# Patient Record
Sex: Female | Born: 1975 | Race: Black or African American | Hispanic: No | Marital: Married | Smoking: Never smoker
Health system: Southern US, Community
[De-identification: ages and names within clinical notes are randomized; demographics above are authoritative.]

## PROBLEM LIST (undated history)

## (undated) DIAGNOSIS — Z9189 Other specified personal risk factors, not elsewhere classified: Secondary | ICD-10-CM

## (undated) DIAGNOSIS — U071 COVID-19: Secondary | ICD-10-CM

## (undated) DIAGNOSIS — R7611 Nonspecific reaction to tuberculin skin test without active tuberculosis: Secondary | ICD-10-CM

## (undated) DIAGNOSIS — E2839 Other primary ovarian failure: Secondary | ICD-10-CM

## (undated) DIAGNOSIS — N6019 Diffuse cystic mastopathy of unspecified breast: Secondary | ICD-10-CM

## (undated) DIAGNOSIS — M549 Dorsalgia, unspecified: Secondary | ICD-10-CM

## (undated) DIAGNOSIS — M8580 Other specified disorders of bone density and structure, unspecified site: Secondary | ICD-10-CM

## (undated) DIAGNOSIS — N970 Female infertility associated with anovulation: Secondary | ICD-10-CM

## (undated) DIAGNOSIS — E288 Other ovarian dysfunction: Secondary | ICD-10-CM

## (undated) HISTORY — DX: Female infertility associated with anovulation: N97.0

## (undated) HISTORY — PX: BREAST SURGERY: SHX581

## (undated) HISTORY — DX: Nonspecific reaction to tuberculin skin test without active tuberculosis: R76.11

## (undated) HISTORY — PX: BREAST LUMPECTOMY: SHX2

## (undated) HISTORY — DX: Other specified disorders of bone density and structure, unspecified site: M85.80

## (undated) HISTORY — DX: Other specified personal risk factors, not elsewhere classified: Z91.89

## (undated) HISTORY — DX: Diffuse cystic mastopathy of unspecified breast: N60.19

## (undated) HISTORY — PX: BREAST EXCISIONAL BIOPSY: SUR124

## (undated) HISTORY — DX: Dorsalgia, unspecified: M54.9

## (undated) HISTORY — DX: Other ovarian dysfunction: E28.8

## (undated) HISTORY — DX: Other primary ovarian failure: E28.39

## (undated) HISTORY — PX: BREAST BIOPSY: SHX20

## (undated) HISTORY — DX: COVID-19: U07.1

---

## 1997-07-22 HISTORY — PX: BREAST LUMPECTOMY: SHX2

## 1997-08-22 ENCOUNTER — Ambulatory Visit (HOSPITAL_COMMUNITY): Admission: RE | Admit: 1997-08-22 | Discharge: 1997-08-22 | Payer: Self-pay | Admitting: Obstetrics and Gynecology

## 1997-12-28 ENCOUNTER — Ambulatory Visit (HOSPITAL_COMMUNITY): Admission: RE | Admit: 1997-12-28 | Discharge: 1997-12-28 | Payer: Self-pay | Admitting: Family Medicine

## 2004-03-09 ENCOUNTER — Other Ambulatory Visit: Admission: RE | Admit: 2004-03-09 | Discharge: 2004-03-09 | Payer: Self-pay | Admitting: Obstetrics and Gynecology

## 2006-08-19 ENCOUNTER — Encounter: Admission: RE | Admit: 2006-08-19 | Discharge: 2006-08-19 | Payer: Self-pay | Admitting: Emergency Medicine

## 2006-08-19 ENCOUNTER — Encounter: Admission: RE | Admit: 2006-08-19 | Discharge: 2006-11-17 | Payer: Self-pay | Admitting: Emergency Medicine

## 2006-10-22 ENCOUNTER — Encounter: Admission: RE | Admit: 2006-10-22 | Discharge: 2006-10-22 | Payer: Self-pay | Admitting: Obstetrics and Gynecology

## 2015-07-04 ENCOUNTER — Ambulatory Visit (INDEPENDENT_AMBULATORY_CARE_PROVIDER_SITE_OTHER): Payer: Managed Care, Other (non HMO)

## 2015-07-04 ENCOUNTER — Ambulatory Visit (INDEPENDENT_AMBULATORY_CARE_PROVIDER_SITE_OTHER): Payer: Managed Care, Other (non HMO) | Admitting: Emergency Medicine

## 2015-07-04 VITALS — BP 108/76 | HR 60 | Temp 97.7°F | Resp 16 | Ht 69.0 in | Wt 147.0 lb

## 2015-07-04 DIAGNOSIS — Z0000 Encounter for general adult medical examination without abnormal findings: Secondary | ICD-10-CM

## 2015-07-04 DIAGNOSIS — Z111 Encounter for screening for respiratory tuberculosis: Secondary | ICD-10-CM

## 2015-07-04 NOTE — Progress Notes (Signed)
Subjective:  Patient ID: Chelsea Wagner, female    DOB: [birth year]  Age: 39 y.o. MRN: [id number]  CC: Annual Exam   HPI Chelsea Wagner presents  she comes for a annual physical. She has no acute medical problems. Has no complaints. Takes no medication. She does have a history of prior lumpectomy which was negative. 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   She has past surgical history that includes Breast surgery.   Her  family history includes Diabetes in her mother; Heart disease in her mother; Hyperlipidemia in her father; Hypertension in her father and mother; Mental illness in her father.  She   reports that she has never smoked. She has never used smokeless tobacco. She reports that she drinks about 0.6 oz of alcohol per week. She reports that she does not use illicit drugs.  No outpatient prescriptions prior to visit.   No facility-administered medications prior to visit.    Social History   Social History  . Marital Status: Married    Spouse Name: N/A  . Number of Children: N/A  . Years of Education: N/A   Social History Main Topics  . Smoking status: Never Smoker   . Smokeless tobacco: Never Used  . Alcohol Use: 0.6 oz/week    1 Standard drinks or equivalent per week  . Drug Use: No  . Sexual Activity: Not Asked   Other Topics Concern  . None   Social History Narrative  . None     Review of Systems  Constitutional: Negative for fever, chills and appetite change.  HENT: Negative for congestion, ear pain, postnasal drip, sinus pressure and sore throat.   Eyes: Negative for pain and redness.  Respiratory: Negative for cough, shortness of breath and wheezing.   Cardiovascular: Negative for leg swelling.  Gastrointestinal: Negative for nausea, vomiting, abdominal pain, diarrhea, constipation and blood in stool.  Endocrine: Negative for polyuria.  Genitourinary: Negative for dysuria,  urgency, frequency and flank pain.  Musculoskeletal: Negative for gait problem.  Skin: Negative for rash.  Neurological: Negative for weakness and headaches.  Psychiatric/Behavioral: Negative for confusion and decreased concentration. The patient is not nervous/anxious.     Objective:  BP 108/76 mmHg  Pulse 60  Temp(Src) 97.7 F (36.5 C) (Oral)  Resp 16  Ht  (1.753 m)  Wt 147 lb (66.679 kg)  BMI 21.70 kg/m2  SpO2 99%  LMP 11/02/2011  Physical Exam  Constitutional: She is oriented to person, place, and time. She appears well-developed and well-nourished. No distress.  HENT:  Head: Normocephalic and atraumatic.  Right Ear: External ear normal.  Left Ear: External ear normal.  Nose: Nose normal.  Eyes: Conjunctivae and EOM are normal. Pupils are equal, round, and reactive to light. No scleral icterus.  Neck: Normal range of motion. Neck supple. No tracheal deviation present.  Cardiovascular: Normal rate, regular rhythm and normal heart sounds.   Pulmonary/Chest: Effort normal. No respiratory distress. She has no wheezes. She has no rales.  Abdominal: She exhibits no mass. There is no tenderness. There is no rebound and no guarding.  Musculoskeletal: She exhibits no edema.  Lymphadenopathy:    She has no cervical adenopathy.  Neurological: She is alert and oriented to person, place, and time. Coordination normal.  Skin: Skin is warm and dry. No rash noted.  Psychiatric: She has a normal mood and affect. Her behavior is normal.  Assessment & Plan:   Chelsea Wagner was seen today for annual exam.  Diagnoses and all orders for this visit:  Annual physical exam -     Cancel: TB Skin Test -     Prescription Abuse Monitoring, 10P, Urine  Screening-pulmonary TB -     DG Chest 2 View; Future  Chelsea Wagner does not currently have medications on file.  No orders of the defined types were placed in this encounter.    Appropriate red flag conditions were discussed with the  patient as well as actions that should be taken.  Patient expressed his understanding.  Follow-up: No Follow-up on file.  Carmelina DaneAnderson, Tanveer Brammer S, MD    [facility] reading (PRIMARY) by  Dr. Dareen PianoAnderson negative chest.

## 2015-07-05 LAB — PRESCRIPTION ABUSE MONITORING 10P, URINE
Amphetamine/Meth: NEGATIVE ng/mL
BUPRENORPHINE, URINE: NEGATIVE ng/mL
Barbiturate Screen, Urine: NEGATIVE ng/mL
Benzodiazepine Screen, Urine: NEGATIVE ng/mL
CANNABINOID SCRN UR: NEGATIVE ng/mL
CREATININE, URINE: 64.51 mg/dL (ref >20.0)
Cocaine Metabolites: NEGATIVE ng/mL
Methadone Screen, Urine: NEGATIVE ng/mL
OPIATE SCREEN, URINE: NEGATIVE ng/mL
OXYCODONE SCRN UR: NEGATIVE ng/mL
PROPOXYPHENE: NEGATIVE ng/mL

## 2016-05-03 ENCOUNTER — Ambulatory Visit: Payer: Self-pay | Admitting: Family Medicine

## 2016-05-22 ENCOUNTER — Encounter: Payer: Self-pay | Admitting: Family Medicine

## 2016-05-22 ENCOUNTER — Ambulatory Visit (INDEPENDENT_AMBULATORY_CARE_PROVIDER_SITE_OTHER): Payer: Managed Care, Other (non HMO) | Admitting: Family Medicine

## 2016-05-22 VITALS — BP 106/68 | HR 72 | Temp 97.9°F | Resp 12 | Ht 69.0 in | Wt 146.4 lb

## 2016-05-22 DIAGNOSIS — Z1231 Encounter for screening mammogram for malignant neoplasm of breast: Secondary | ICD-10-CM | POA: Diagnosis not present

## 2016-05-22 DIAGNOSIS — Z0000 Encounter for general adult medical examination without abnormal findings: Secondary | ICD-10-CM

## 2016-05-22 DIAGNOSIS — Z1239 Encounter for other screening for malignant neoplasm of breast: Secondary | ICD-10-CM

## 2016-05-22 DIAGNOSIS — E2839 Other primary ovarian failure: Secondary | ICD-10-CM

## 2016-05-22 DIAGNOSIS — Z23 Encounter for immunization: Secondary | ICD-10-CM | POA: Diagnosis not present

## 2016-05-22 DIAGNOSIS — E288 Other ovarian dysfunction: Secondary | ICD-10-CM

## 2016-05-22 LAB — LIPID PANEL
Cholesterol: 198 mg/dL (ref 0–200)
HDL: 98.3 mg/dL (ref >39.00)
LDL CALC: 89 mg/dL (ref 0–99)
NONHDL: 99.22
Total CHOL/HDL Ratio: 2
Triglycerides: 52 mg/dL (ref 0.0–149.0)
VLDL: 10.4 mg/dL (ref 0.0–40.0)

## 2016-05-22 LAB — COMPREHENSIVE METABOLIC PANEL
ALK PHOS: 53 U/L (ref 39–117)
ALT: 16 U/L (ref 0–35)
AST: 20 U/L (ref 0–37)
Albumin: 4.5 g/dL (ref 3.5–5.2)
BUN: 13 mg/dL (ref 6–23)
CHLORIDE: 103 meq/L (ref 96–112)
CO2: 30 mEq/L (ref 19–32)
Calcium: 10 mg/dL (ref 8.4–10.5)
Creatinine, Ser: 0.89 mg/dL (ref 0.40–1.20)
GFR: 89.46 mL/min (ref >60.00)
GLUCOSE: 63 mg/dL — AB (ref 70–99)
POTASSIUM: 4.1 meq/L (ref 3.5–5.1)
Sodium: 140 mEq/L (ref 135–145)
TOTAL PROTEIN: 7.6 g/dL (ref 6.0–8.3)
Total Bilirubin: 0.5 mg/dL (ref 0.2–1.2)

## 2016-05-22 LAB — CBC
HEMATOCRIT: 42.3 % (ref 36.0–46.0)
HEMOGLOBIN: 14.2 g/dL (ref 12.0–15.0)
MCHC: 33.5 g/dL (ref 30.0–36.0)
MCV: 97 fl (ref 78.0–100.0)
Platelets: 303 10*3/uL (ref 150.0–400.0)
RBC: 4.36 Mil/uL (ref 3.87–5.11)
RDW: 14.5 % (ref 11.5–15.5)
WBC: 5.2 10*3/uL (ref 4.0–10.5)

## 2016-05-22 NOTE — Assessment & Plan Note (Signed)
Pap smear up-to-date. Wants to see a female GYN. Placing referral. In need of mammogram. Ordered today. Tetanus up to date. Flu given today. Screening labs today.

## 2016-05-22 NOTE — Patient Instructions (Signed)
We will call with the referral and with your lab results.  We will also call regarding your mammogram.  Follow up annually.  Take care  Dr. Lacinda Axon   Health Maintenance, Female Adopting a healthy lifestyle and getting preventive care can go a long way to promote health and wellness. Talk with your health care provider about what schedule of regular examinations is right for you. This is a good chance for you to check in with your provider about disease prevention and staying healthy. In between checkups, there are plenty of things you can do on your own. Experts have done a lot of research about which lifestyle changes and preventive measures are most likely to keep you healthy. Ask your health care provider for more information. WEIGHT AND DIET  Eat a healthy diet  Be sure to include plenty of vegetables, fruits, low-fat dairy products, and lean protein.  Do not eat a lot of foods high in solid fats, added sugars, or salt.  Get regular exercise. This is one of the most important things you can do for your health.  Most adults should exercise for at least 150 minutes each week. The exercise should increase your heart rate and make you sweat (moderate-intensity exercise).  Most adults should also do strengthening exercises at least twice a week. This is in addition to the moderate-intensity exercise.  Maintain a healthy weight  Body mass index (BMI) is a measurement that can be used to identify possible weight problems. It estimates body fat based on height and weight. Your health care provider can help determine your BMI and help you achieve or maintain a healthy weight.  For females 8 years of age and older:   A BMI below 18.5 is considered underweight.  A BMI of 18.5 to 24.9 is normal.  A BMI of 25 to 29.9 is considered overweight.  A BMI of 30 and above is considered obese.  Watch levels of cholesterol and blood lipids  You should start having your blood tested for lipids  and cholesterol at 42 years of age, then have this test every 5 years.  You may need to have your cholesterol levels checked more often if:  Your lipid or cholesterol levels are high.  You are older than 42 years of age.  You are at high risk for heart disease.  CANCER SCREENING   Lung Cancer  Lung cancer screening is recommended for adults 22-20 years old who are at high risk for lung cancer because of a history of smoking.  A yearly low-dose CT scan of the lungs is recommended for people who:  Currently smoke.  Have quit within the past 15 years.  Have at least a 30-pack-year history of smoking. A pack year is smoking an average of one pack of cigarettes a day for 1 year.  Yearly screening should continue until it has been 15 years since you quit.  Yearly screening should stop if you develop a health problem that would prevent you from having lung cancer treatment.  Breast Cancer  Practice breast self-awareness. This means understanding how your breasts normally appear and feel.  It also means doing regular breast self-exams. Let your health care provider know about any changes, no matter how small.  If you are in your 20s or 30s, you should have a clinical breast exam (CBE) by a health care provider every 1-3 years as part of a regular health exam.  If you are 51 or older, have a CBE every year.  Also consider having a breast X-ray (mammogram) every year.  If you have a family history of breast cancer, talk to your health care provider about genetic screening.  If you are at high risk for breast cancer, talk to your health care provider about having an MRI and a mammogram every year.  Breast cancer gene (BRCA) assessment is recommended for women who have family members with BRCA-related cancers. BRCA-related cancers include:  Breast.  Ovarian.  Tubal.  Peritoneal cancers.  Results of the assessment will determine the need for genetic counseling and BRCA1 and  BRCA2 testing. Cervical Cancer Your health care provider may recommend that you be screened regularly for cancer of the pelvic organs (ovaries, uterus, and vagina). This screening involves a pelvic examination, including checking for microscopic changes to the surface of your cervix (Pap test). You may be encouraged to have this screening done every 3 years, beginning at age 67.  For women ages 48-65, health care providers may recommend pelvic exams and Pap testing every 3 years, or they may recommend the Pap and pelvic exam, combined with testing for human papilloma virus (HPV), every 5 years. Some types of HPV increase your risk of cervical cancer. Testing for HPV may also be done on women of any age with unclear Pap test results.  Other health care providers may not recommend any screening for nonpregnant women who are considered low risk for pelvic cancer and who do not have symptoms. Ask your health care provider if a screening pelvic exam is right for you.  If you have had past treatment for cervical cancer or a condition that could lead to cancer, you need Pap tests and screening for cancer for at least 20 years after your treatment. If Pap tests have been discontinued, your risk factors (such as having a new sexual partner) need to be reassessed to determine if screening should resume. Some women have medical problems that increase the chance of getting cervical cancer. In these cases, your health care provider may recommend more frequent screening and Pap tests. Colorectal Cancer  This type of cancer can be detected and often prevented.  Routine colorectal cancer screening usually begins at 42 years of age and continues through 42 years of age.  Your health care provider may recommend screening at an earlier age if you have risk factors for colon cancer.  Your health care provider may also recommend using home test kits to check for hidden blood in the stool.  A small camera at the end of  a tube can be used to examine your colon directly (sigmoidoscopy or colonoscopy). This is done to check for the earliest forms of colorectal cancer.  Routine screening usually begins at age 71.  Direct examination of the colon should be repeated every 5-10 years through 42 years of age. However, you may need to be screened more often if early forms of precancerous polyps or small growths are found. Skin Cancer  Check your skin from head to toe regularly.  Tell your health care provider about any new moles or changes in moles, especially if there is a change in a mole's shape or color.  Also tell your health care provider if you have a mole that is larger than the size of a pencil eraser.  Always use sunscreen. Apply sunscreen liberally and repeatedly throughout the day.  Protect yourself by wearing long sleeves, pants, a wide-brimmed hat, and sunglasses whenever you are outside. HEART DISEASE, DIABETES, AND HIGH BLOOD PRESSURE  High blood pressure causes heart disease and increases the risk of stroke. High blood pressure is more likely to develop in:  People who have blood pressure in the high end of the normal range (130-139/85-89 mm Hg).  People who are overweight or obese.  People who are African American.  If you are 18-39 years of age, have your blood pressure checked every 3-5 years. If you are 40 years of age or older, have your blood pressure checked every year. You should have your blood pressure measured twice--once when you are at a hospital or clinic, and once when you are not at a hospital or clinic. Record the average of the two measurements. To check your blood pressure when you are not at a hospital or clinic, you can use:  An automated blood pressure machine at a pharmacy.  A home blood pressure monitor.  If you are between 55 years and 79 years old, ask your health care provider if you should take aspirin to prevent strokes.  Have regular diabetes screenings. This  involves taking a blood sample to check your fasting blood sugar level.  If you are at a normal weight and have a low risk for diabetes, have this test once every three years after 42 years of age.  If you are overweight and have a high risk for diabetes, consider being tested at a younger age or more often. PREVENTING INFECTION  Hepatitis B  If you have a higher risk for hepatitis B, you should be screened for this virus. You are considered at high risk for hepatitis B if:  You were born in a country where hepatitis B is common. Ask your health care provider which countries are considered high risk.  Your parents were born in a high-risk country, and you have not been immunized against hepatitis B (hepatitis B vaccine).  You have HIV or AIDS.  You use needles to inject street drugs.  You live with someone who has hepatitis B.  You have had sex with someone who has hepatitis B.  You get hemodialysis treatment.  You take certain medicines for conditions, including cancer, organ transplantation, and autoimmune conditions. Hepatitis C  Blood testing is recommended for:  Everyone born from 1945 through 1965.  Anyone with known risk factors for hepatitis C. Sexually transmitted infections (STIs)  You should be screened for sexually transmitted infections (STIs) including gonorrhea and chlamydia if:  You are sexually active and are younger than 42 years of age.  You are older than 42 years of age and your health care provider tells you that you are at risk for this type of infection.  Your sexual activity has changed since you were last screened and you are at an increased risk for chlamydia or gonorrhea. Ask your health care provider if you are at risk.  If you do not have HIV, but are at risk, it may be recommended that you take a prescription medicine daily to prevent HIV infection. This is called pre-exposure prophylaxis (PrEP). You are considered at risk if:  You are  sexually active and do not regularly use condoms or know the HIV status of your partner(s).  You take drugs by injection.  You are sexually active with a partner who has HIV. Talk with your health care provider about whether you are at high risk of being infected with HIV. If you choose to begin PrEP, you should first be tested for HIV. You should then be tested every 3 months for as   long as you are taking PrEP.  PREGNANCY   If you are premenopausal and you may become pregnant, ask your health care provider about preconception counseling.  If you may become pregnant, take 400 to 800 micrograms (mcg) of folic acid every day.  If you want to prevent pregnancy, talk to your health care provider about birth control (contraception). OSTEOPOROSIS AND MENOPAUSE   Osteoporosis is a disease in which the bones lose minerals and strength with aging. This can result in serious bone fractures. Your risk for osteoporosis can be identified using a bone density scan.  If you are 27 years of age or older, or if you are at risk for osteoporosis and fractures, ask your health care provider if you should be screened.  Ask your health care provider whether you should take a calcium or vitamin D supplement to lower your risk for osteoporosis.  Menopause may have certain physical symptoms and risks.  Hormone replacement therapy may reduce some of these symptoms and risks. Talk to your health care provider about whether hormone replacement therapy is right for you.  HOME CARE INSTRUCTIONS   Schedule regular health, dental, and eye exams.  Stay current with your immunizations.   Do not use any tobacco products including cigarettes, chewing tobacco, or electronic cigarettes.  If you are pregnant, do not drink alcohol.  If you are breastfeeding, limit how much and how often you drink alcohol.  Limit alcohol intake to no more than 1 drink per day for nonpregnant women. One drink equals 12 ounces of beer, 5  ounces of wine, or 1 ounces of hard liquor.  Do not use street drugs.  Do not share needles.  Ask your health care provider for help if you need support or information about quitting drugs.  Tell your health care provider if you often feel depressed.  Tell your health care provider if you have ever been abused or do not feel safe at home.   This information is not intended to replace advice given to you by your health care provider. Make sure you discuss any questions you have with your health care provider.   Document Released: 01/21/2011 Document Revised: 07/29/2014 Document Reviewed: 06/09/2013 Elsevier Interactive Patient Education [employer].

## 2016-05-22 NOTE — Progress Notes (Signed)
Subjective:  Patient ID: Chelsea Wagner, female    DOB: [birth year]  Age: 40 y.o. MRN: [id number]  CC: Establish care, annual exam  HPI Chelsea Wagner is a 40 y.o. female presents to the clinic today to establish care. She is in need of a physical exam.  Preventative Healthcare  Pap smear: 2 years ago. Wants to see GYN.  Mammogram: In need of. Will order.   Colonoscopy: Not indicated.   Immunizations  Tetanus - Up to date.   Pneumococcal - Not indicated.  Flu - In need of.  Labs: Screening labs today.  Exercise: Reports regular exercise.   Alcohol use: See below.  Smoking/tobacco use: Nonsmoker.  PMH, Surgical Hx, Family Hx, Social History reviewed and updated as below.  Past Medical History:  Diagnosis Date  . History of fainting spells of unknown cause   . 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 Premature ovarian failure     Past Surgical History:  Procedure Laterality Date  . BREAST LUMPECTOMY Left 1999  . BREAST SURGERY     Family History  Problem Relation Age of Onset  . Diabetes Mother   . Heart disease Mother   . Hypertension Mother   . Mental illness Father   . Hypertension Father   . Hyperlipidemia Father    Social History  Substance Use Topics  . Smoking status: Never Smoker  . Smokeless tobacco: Never Used  . Alcohol use 0.6 oz/week    1 Standard drinks or equivalent per week   Review of Systems General: Denies unexplained weight loss, fever. Skin: Denies new or changing mole, sore/wound that won't heal. ENT: Trouble hearing, ringing in the ears, sores in the mouth, hoarseness, trouble swallowing. Eyes: Denies trouble seeing/visual disturbance. Heart/CV: Denies chest pain, shortness of breath, edema, palpitations. Lungs/Resp: Denies cough, shortness of breath, hemoptysis. Abd/GI: Denies nausea, vomiting, diarrhea, constipation, abdominal pain, hematochezia, melena. GU: Denies dysuria, incontinence, hematuria, urinary frequency, difficulty  starting/keeping stream, vaginal discharge, sexual difficulty, lump in breasts. MSK: Denies joint pain/swelling, myalgias. Neuro: Denies headaches, weakness, numbness, dizziness, syncope. Psych: Denies sadness, anxiety, stress, memory difficulty. Endocrine: Denies polyuria and polydipsia.  Objective:   Today's Vitals: BP 106/68 (BP Location: Right Arm, Patient Position: Sitting, Cuff Size: Normal)   Pulse 72   Temp 97.9 F (36.6 C) (Oral)   Resp 12   Ht 5' 9"  (1.753 m)   Wt 146 lb 6 oz (66.4 kg)   SpO2 96%   BMI 21.62 kg/m   Physical Exam  Constitutional: She is oriented to person, place, and time. She appears well-developed and well-nourished. No distress.  HENT:  Head: Normocephalic and atraumatic.  Nose: Nose normal.  Mouth/Throat: Oropharynx is clear and moist. No oropharyngeal exudate.  Normal TM's bilaterally.   Eyes: Conjunctivae are normal. No scleral icterus.  Neck: Neck supple. No thyromegaly present.  Cardiovascular: Normal rate and regular rhythm.   No murmur heard. Pulmonary/Chest: Effort normal and breath sounds normal. She has no wheezes. She has no rales.  Abdominal: Soft. She exhibits no distension. There is no tenderness. There is no rebound and no guarding.  Musculoskeletal: Normal range of motion. She exhibits no edema.  Lymphadenopathy:    She has no cervical adenopathy.  Neurological: She is alert and oriented to person, place, and time.  Skin: Skin is warm and dry. No rash noted.  Psychiatric: She has a normal mood and affect.  Vitals reviewed.  Assessment & Plan:   Problem List Items Addressed This Visit  Premature ovarian failure   Relevant Orders   Ambulatory referral to Gynecology   Annual physical exam - Primary    Pap smear up-to-date. Wants to see a female GYN. Placing referral. In need of mammogram. Ordered today. Tetanus up to date. Flu given today. Screening labs today.      Relevant Orders   CBC   Comp Met (CMET)   Lipid  panel    Other Visit Diagnoses    Breast cancer screening       Relevant Orders   MM Digital Screening     Follow-up: Return in about 1 year (around 05/22/2017).  [place]

## 2016-05-27 ENCOUNTER — Encounter: Payer: Self-pay | Admitting: Family Medicine

## 2016-06-05 ENCOUNTER — Encounter: Payer: Self-pay | Admitting: Family Medicine

## 2016-07-16 ENCOUNTER — Ambulatory Visit
Admission: RE | Admit: 2016-07-16 | Discharge: 2016-07-16 | Disposition: A | Discharge status: Home / Self Care | Payer: Managed Care, Other (non HMO) | Source: Ambulatory Visit | Attending: Family Medicine | Admitting: Family Medicine

## 2016-07-16 DIAGNOSIS — Z1239 Encounter for other screening for malignant neoplasm of breast: Secondary | ICD-10-CM

## 2016-07-16 DIAGNOSIS — Z1231 Encounter for screening mammogram for malignant neoplasm of breast: Secondary | ICD-10-CM | POA: Diagnosis present

## 2016-09-03 ENCOUNTER — Telehealth: Payer: Self-pay | Admitting: Family Medicine

## 2016-09-03 NOTE — Telephone Encounter (Signed)
Given to provider to feel out.

## 2016-09-03 NOTE — Telephone Encounter (Signed)
Pt dropped off Health Examination form to be filled out.Marland Kitchen. Please advise.Marland Kitchen. Placed in Dr. Shiela Mayerooks folder upfront.. Pt states that a letter was faxed to us to help fill form out this form.. Please fax to [phone] when completed

## 2016-09-04 NOTE — Telephone Encounter (Signed)
Form was faxed 

## 2016-09-04 NOTE — Telephone Encounter (Signed)
Pt called and a voicemail was left letting her know form was faxed.

## 2016-09-04 NOTE — Telephone Encounter (Signed)
Form filled out

## 2017-02-05 ENCOUNTER — Telehealth: Payer: Self-pay | Admitting: Family Medicine

## 2017-02-05 NOTE — Telephone Encounter (Signed)
Reason for call: low back pain  Symptoms: numbness left leg, hurts to sit, walking makes it worse,  Duration this week  Medications: Last seen for this problem: n/a  Seen by: n/a No appointments available today, gave number to [facility]

## 2017-02-05 NOTE — Telephone Encounter (Signed)
Pt's states she is in severe back pain. She is requesting a referral to a back surgeon. I told her she would most likely have to see Dr. Adriana Simasook first before a referral. Please advise pt.

## 2017-02-24 ENCOUNTER — Encounter: Payer: Self-pay | Admitting: Family Medicine

## 2017-05-23 ENCOUNTER — Encounter: Payer: Managed Care, Other (non HMO) | Admitting: Family Medicine

## 2017-06-10 ENCOUNTER — Other Ambulatory Visit: Payer: Self-pay

## 2017-06-10 ENCOUNTER — Ambulatory Visit (INDEPENDENT_AMBULATORY_CARE_PROVIDER_SITE_OTHER): Payer: Managed Care, Other (non HMO) | Admitting: Internal Medicine

## 2017-06-10 ENCOUNTER — Encounter: Payer: Self-pay | Admitting: Internal Medicine

## 2017-06-10 VITALS — BP 98/66 | HR 67 | Temp 98.0°F | Ht 69.0 in | Wt 153.0 lb

## 2017-06-10 DIAGNOSIS — M8588 Other specified disorders of bone density and structure, other site: Secondary | ICD-10-CM

## 2017-06-10 DIAGNOSIS — Z13220 Encounter for screening for lipoid disorders: Secondary | ICD-10-CM | POA: Diagnosis not present

## 2017-06-10 DIAGNOSIS — Z0000 Encounter for general adult medical examination without abnormal findings: Secondary | ICD-10-CM | POA: Diagnosis not present

## 2017-06-10 DIAGNOSIS — Z1329 Encounter for screening for other suspected endocrine disorder: Secondary | ICD-10-CM | POA: Diagnosis not present

## 2017-06-10 DIAGNOSIS — Z1231 Encounter for screening mammogram for malignant neoplasm of breast: Secondary | ICD-10-CM | POA: Diagnosis not present

## 2017-06-10 DIAGNOSIS — Z1159 Encounter for screening for other viral diseases: Secondary | ICD-10-CM | POA: Diagnosis not present

## 2017-06-10 NOTE — Progress Notes (Signed)
Chief Complaint  Patient presents with  . Annual Exam  . URI   Annual wellness  1. Pt reports she has cold today x 1 week tried tea, Mucinex. She reports sx's started in chest and now progressed to nasal and head congestion though smell not limited. She also reports h/o unpacking dusty boxes.  She is overall feeling better. Denies sore throat, chills, body aches, fever.  2. H/o osteopenia [facility] [place] need to get copy. She is sch for pap 08/2017 [facility]    URI   Associated symptoms include congestion and coughing. Pertinent negatives include no abdominal pain, chest pain or sore throat.   Review of Systems  Constitutional: Negative for chills and fever.  HENT: Positive for congestion. Negative for sore throat.   Respiratory: Positive for cough. Negative for shortness of breath.   Cardiovascular: Negative for chest pain.  Gastrointestinal: Negative for abdominal pain and blood in stool.   Past Medical History:  Diagnosis Date  . Back pain    low back pain   . Fibrocystic breast   . History of fainting spells of unknown cause   . Infertility associated with anovulation   . Osteopenia   . 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 Premature ovarian failure    Past Surgical History:  Procedure Laterality Date  . BREAST BIOPSY Left    age 41's  . BREAST LUMPECTOMY Left 1999  . BREAST LUMPECTOMY     benign tumor   . BREAST SURGERY     Family History  Problem Relation Age of Onset  . Diabetes Mother   . Heart disease Mother   . Hypertension Mother   . Fibromyalgia Mother   . Other Mother        cardiac arrythmia   . Mental illness Father        bipolar   . Hypertension Father   . Hyperlipidemia Father   . Diabetes Father   . Kidney disease Father   . Bradycardia Father   . Breast cancer Paternal Aunt [id number]  . Cancer Paternal Aunt        breast dx'ed in 4340s    Social History   Socioeconomic History  . Marital status: Married   Spouse name: Not on file  . Number of children: Not on file  . Years of education: Not on file  . Highest education level: Not on file  Social Needs  . Financial resource strain: Not on file  . Food insecurity - worry: Not on file  . Food insecurity - inability: Not on file  . Transportation needs - medical: Not on file  . Transportation needs - non-medical: Not on file  Occupational History  . Not on file  Tobacco Use  . Smoking status: Never Smoker  . Smokeless tobacco: Never Used  Substance and Sexual Activity  . Alcohol use: Yes    Alcohol/week: 0.6 oz    Types: 1 Standard drinks or equivalent per week  . Drug use: No  . Sexual activity: Yes    Partners: Male  Other Topics Concern  . 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 calcium citrate-vitamin D (CITRACAL+D) 315-200 MG-UNIT tablet Take 1 tablet by mouth 2 (two) times daily.  . Multiple Vitamins-Minerals (ALIVE ONCE DAILY WOMENS PO) Take by mouth.   Allergies  Allergen Reactions  . Sulfa Antibiotics Hives  . Sulfur    Vitals:   06/10/17 1309  Weight: 153 lb (69.4 kg)  Height: 5\' 9"  (1.753 m)   Vitals:   06/10/17 1309  BP: 98/66  Pulse: 67  Temp: 98 F (36.7 C)  SpO2: 97%   Objective  Physical Exam  Constitutional: She is oriented to person, place, and time and well-developed, well-nourished, and in no distress. Vital signs are normal.  HENT:  Head: Normocephalic and atraumatic.  Mouth/Throat: Oropharynx is clear and moist and mucous membranes are normal. No posterior oropharyngeal erythema.  Eyes: Conjunctivae are normal. Pupils are equal, round, and reactive to light. No scleral icterus.  Cardiovascular: Normal rate, regular  rhythm and normal heart sounds.  Pulmonary/Chest: Effort normal and breath sounds normal.  Normal breast and axilla exam today   Abdominal: Soft. Bowel sounds are normal. There is no tenderness.  Neurological: She is alert and oriented to person, place, and time.  Skin: Skin is warm, dry and intact.  Psychiatric: Mood, memory, affect and judgment normal.  Nursing note and vitals reviewed.  Assessment   1. Annual wellness  2. H/o osteopenia and vit D def  3. URI  4. HM Plan  1. Check fasting labs tomorrow CMET, CBC, lipid, UA, TSH, T4, vit D  2. See labs as above get copy of DEXA [facility]  3. Add flonase and antihistamine to mucinex and tea f/u if not better supportive care  4. Had not yet had flu shot Tdap had age 41 will need age 41  Will check hep b immunity  Will disc HPV vax in future approved until age 41   Referred for mammogram [place] Pt has pap (no h/o abnormal pap) sch [facility]2/2019 and also get records   [place]  Get copy of DEXA  Given name of Dr. April MansonYalcinkaya in [place] for fertility   Provider: Dr. French Anaracy McLean-Scocuzza

## 2017-06-10 NOTE — Patient Instructions (Signed)
Please try Zyrtec or Claritin or Allegra at night  Please try Flonase 1-2 sprays each nostril daily as needed while sick  Continue Mucinex, cough drops if needed  We will refer you for mammogram  Please come tomorrow for blood work  Continue to follow up every 6 months or as needed  Dr. Nancee LiterYalcinkaya [place] [phone] -Fertility OB/GYN   Upper Respiratory Infection, Adult Most upper respiratory infections (URIs) are caused by a virus. A URI affects the nose, throat, and upper air passages. The most common type of URI is often called "the common cold." Follow these instructions at home:  Take medicines only as told by your doctor.  Gargle warm saltwater or take cough drops to comfort your throat as told by your doctor.  Use a warm mist humidifier or inhale steam from a shower to increase air moisture. This may make it easier to breathe.  Drink enough fluid to keep your pee (urine) clear or pale yellow.  Eat soups and other clear broths.  Have a healthy diet.  Rest as needed.  Go back to work when your fever is gone or your doctor says it is okay. ? You may need to stay home longer to avoid giving your URI to others. ? You can also wear a face mask and wash your hands often to prevent spread of the virus.  Use your inhaler more if you have asthma.  Do not use any tobacco products, including cigarettes, chewing tobacco, or electronic cigarettes. If you need help quitting, ask your doctor. Contact a doctor if:  You are getting worse, not better.  Your symptoms are not helped by medicine.  You have chills.  You are getting more short of breath.  You have brown or red mucus.  You have yellow or brown discharge from your nose.  You have pain in your face, especially when you bend forward.  You have a fever.  You have puffy (swollen) neck glands.  You have pain while swallowing.  You have white areas in the back of your throat. Get help right away if:  You  have very bad or constant: ? Headache. ? Ear pain. ? Pain in your forehead, behind your eyes, and over your cheekbones (sinus pain). ? Chest pain.  You have long-lasting (chronic) lung disease and any of the following: ? Wheezing. ? Long-lasting cough. ? Coughing up blood. ? A change in your usual mucus.  You have a stiff neck.  You have changes in your: ? Vision. ? Hearing. ? Thinking. ? Mood. This information is not intended to replace advice given to you by your health care provider. Make sure you discuss any questions you have with your health care provider. Document Released: 12/25/2007 Document Revised: 03/10/2016 Document Reviewed: 10/13/2013 Elsevier Interactive Patient Education  2018 [employer]

## 2017-06-11 ENCOUNTER — Other Ambulatory Visit (INDEPENDENT_AMBULATORY_CARE_PROVIDER_SITE_OTHER): Payer: Managed Care, Other (non HMO)

## 2017-06-11 DIAGNOSIS — Z1159 Encounter for screening for other viral diseases: Secondary | ICD-10-CM

## 2017-06-11 DIAGNOSIS — Z13220 Encounter for screening for lipoid disorders: Secondary | ICD-10-CM

## 2017-06-11 DIAGNOSIS — Z0000 Encounter for general adult medical examination without abnormal findings: Secondary | ICD-10-CM | POA: Diagnosis not present

## 2017-06-11 DIAGNOSIS — M8588 Other specified disorders of bone density and structure, other site: Secondary | ICD-10-CM | POA: Diagnosis not present

## 2017-06-11 LAB — POCT URINALYSIS DIPSTICK
Bilirubin, UA: NEGATIVE
Blood, UA: NEGATIVE
GLUCOSE UA: NEGATIVE
Ketones, UA: NEGATIVE
NITRITE UA: NEGATIVE
Protein, UA: NEGATIVE
Spec Grav, UA: <=1.005 — AB (ref 1.010–1.025)
UROBILINOGEN UA: 0.2 U/dL
pH, UA: 5 (ref 5.0–8.0)

## 2017-06-11 LAB — LIPID PANEL
CHOLESTEROL: 221 mg/dL — AB (ref 0–200)
HDL: 96.8 mg/dL (ref >39.00)
LDL CALC: 117 mg/dL — AB (ref 0–99)
NonHDL: 124.41
TRIGLYCERIDES: 39 mg/dL (ref 0.0–149.0)
Total CHOL/HDL Ratio: 2
VLDL: 7.8 mg/dL (ref 0.0–40.0)

## 2017-06-11 LAB — CBC WITH DIFFERENTIAL/PLATELET
BASOS PCT: 0.8 % (ref 0.0–3.0)
Basophils Absolute: 0.1 10*3/uL (ref 0.0–0.1)
EOS ABS: 0.2 10*3/uL (ref 0.0–0.7)
Eosinophils Relative: 2.5 % (ref 0.0–5.0)
HCT: 40.6 % (ref 36.0–46.0)
HEMOGLOBIN: 13.5 g/dL (ref 12.0–15.0)
LYMPHS ABS: 1.6 10*3/uL (ref 0.7–4.0)
Lymphocytes Relative: 21.2 % (ref 12.0–46.0)
MCHC: 33.4 g/dL (ref 30.0–36.0)
MCV: 97.4 fl (ref 78.0–100.0)
MONO ABS: 0.6 10*3/uL (ref 0.1–1.0)
Monocytes Relative: 7.8 % (ref 3.0–12.0)
NEUTROS PCT: 67.7 % (ref 43.0–77.0)
Neutro Abs: 5 10*3/uL (ref 1.4–7.7)
PLATELETS: 284 10*3/uL (ref 150.0–400.0)
RBC: 4.17 Mil/uL (ref 3.87–5.11)
RDW: 13.9 % (ref 11.5–15.5)
WBC: 7.4 10*3/uL (ref 4.0–10.5)

## 2017-06-11 LAB — COMPREHENSIVE METABOLIC PANEL
ALBUMIN: 4.4 g/dL (ref 3.5–5.2)
ALT: 13 U/L (ref 0–35)
AST: 21 U/L (ref 0–37)
Alkaline Phosphatase: 46 U/L (ref 39–117)
BUN: 14 mg/dL (ref 6–23)
CHLORIDE: 103 meq/L (ref 96–112)
CO2: 30 mEq/L (ref 19–32)
CREATININE: 0.82 mg/dL (ref 0.40–1.20)
Calcium: 9.9 mg/dL (ref 8.4–10.5)
GFR: 97.84 mL/min (ref >60.00)
Glucose, Bld: 82 mg/dL (ref 70–99)
Potassium: 4.1 mEq/L (ref 3.5–5.1)
SODIUM: 138 meq/L (ref 135–145)
TOTAL PROTEIN: 7.2 g/dL (ref 6.0–8.3)
Total Bilirubin: 0.5 mg/dL (ref 0.2–1.2)

## 2017-06-11 LAB — T4, FREE: Free T4: 0.7 ng/dL (ref 0.60–1.60)

## 2017-06-11 LAB — VITAMIN D 25 HYDROXY (VIT D DEFICIENCY, FRACTURES): VITD: 31.33 ng/mL (ref 30.00–100.00)

## 2017-06-11 LAB — TSH: TSH: 2.01 u[IU]/mL (ref 0.35–4.50)

## 2017-06-12 LAB — HEPATITIS B SURFACE ANTIBODY, QUANTITATIVE: Hepatitis B-Post: <5 m[IU]/mL — ABNORMAL LOW (ref >=10)

## 2017-06-25 ENCOUNTER — Ambulatory Visit (INDEPENDENT_AMBULATORY_CARE_PROVIDER_SITE_OTHER): Payer: Managed Care, Other (non HMO) | Admitting: *Deleted

## 2017-06-25 DIAGNOSIS — Z23 Encounter for immunization: Secondary | ICD-10-CM | POA: Diagnosis not present

## 2017-06-25 NOTE — Progress Notes (Signed)
Patient tolerated vaccine well 

## 2017-07-29 ENCOUNTER — Ambulatory Visit (INDEPENDENT_AMBULATORY_CARE_PROVIDER_SITE_OTHER): Payer: Managed Care, Other (non HMO) | Admitting: *Deleted

## 2017-07-29 DIAGNOSIS — Z23 Encounter for immunization: Secondary | ICD-10-CM | POA: Diagnosis not present

## 2017-08-08 ENCOUNTER — Ambulatory Visit
Admission: RE | Admit: 2017-08-08 | Discharge: 2017-08-08 | Disposition: A | Discharge status: Home / Self Care | Payer: Managed Care, Other (non HMO) | Source: Ambulatory Visit | Attending: Internal Medicine | Admitting: Internal Medicine

## 2017-08-08 DIAGNOSIS — Z1231 Encounter for screening mammogram for malignant neoplasm of breast: Secondary | ICD-10-CM | POA: Insufficient documentation

## 2017-09-03 LAB — HM PAP SMEAR: HM Pap smear: NORMAL

## 2017-12-23 ENCOUNTER — Ambulatory Visit: Payer: Managed Care, Other (non HMO) | Admitting: Family Medicine

## 2017-12-23 ENCOUNTER — Encounter: Payer: Self-pay | Admitting: Family Medicine

## 2017-12-23 VITALS — BP 110/82 | HR 69 | Temp 98.2°F | Wt 149.4 lb

## 2017-12-23 DIAGNOSIS — H6123 Impacted cerumen, bilateral: Secondary | ICD-10-CM

## 2017-12-23 NOTE — Progress Notes (Signed)
Subjective:    Patient ID: Chelsea Wagner, female    DOB: 1975-11-24, 42 y.o.   MRN: [id number]  HPI  Chelsea Wagner is a 42 year old female who presents today with ear fullness that has been present for one week. She reports that she tried to use Q-tips to try and remove wax in her ear but feels she made it worse. She feels that ears are "muffled" and left is worse than right. Associated decreased hearing and post nasal drip have been present. She denies fever, chills, sweats, rhinitis, sinus pressure/pain, ear pain, cough, sore throat, or SOB. No treatments have been tried at home. Aggravated by use of Q-tips. She is not a smoker  Review of Systems  Constitutional: Negative for chills, fatigue and fever.  HENT: Positive for postnasal drip. Negative for congestion, ear discharge, ear pain, rhinorrhea, sinus pressure, sinus pain, sneezing, sore throat and tinnitus.        Ear fullness bilaterally  Eyes: Negative for discharge and itching.  Respiratory: Negative for cough, shortness of breath and wheezing.   Cardiovascular: Negative for chest pain and palpitations.  Skin: Negative for rash.  Neurological: Negative for dizziness and headaches.   Past Medical History:  Diagnosis Date  . Back pain    low back pain   . Fibrocystic breast   . History of fainting spells of unknown cause   . Infertility associated with anovulation   . Osteopenia   . 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 Premature ovarian failure      Social History   Socioeconomic History  . Marital status: Married    Spouse name: Not on file  . Number of children: Not on file  . Years of education: Not on file  . Highest education level: Not on file  Occupational History  . Not on file  Social Needs  . Financial resource strain: Not on file  . Food insecurity:    Worry: Not on file    Inability: Not on file  . Transportation needs:    Medical: Not on file    Non-medical: Not on file  Tobacco Use  . Smoking  status: Never Smoker  . Smokeless tobacco: Never Used  Substance and Sexual Activity  . Alcohol use: Yes    Alcohol/week: 0.6 oz    Types: 1 Standard drinks or equivalent per week  . Drug use: No  . Sexual activity: Yes    Partners: Male  Lifestyle  . Physical activity:    Days per week: Not on file    Minutes per session: Not on file  . Stress: Not on file  Relationships  . Social connections:    Talks on phone: Not on file    Gets together: Not on file    Attends religious service: Not on file    Active member of club or organization: Not on file    Attends meetings of clubs or organizations: Not on file    Relationship status: Not on file  . Intimate partner violence:    Fear of current or ex partner: No    Emotionally abused: Not on file    Physically abused: Not on file    Forced sexual activity: Not on file  Other Topics Concern  . 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 BREAST BIOPSY Left    age 72's  . BREAST LUMPECTOMY Left 1999  . BREAST LUMPECTOMY     benign tumor   . BREAST SURGERY      Family History  Problem Relation Age of Onset  . Diabetes Mother   . Heart disease Mother   . Hypertension Mother   . Fibromyalgia Mother   . Other Mother        cardiac arrythmia   . Mental illness Father        bipolar   . Hypertension Father   . Hyperlipidemia Father   . Diabetes Father   . Kidney disease Father   . Bradycardia Father   . Breast cancer Paternal Aunt 11  . Cancer Paternal Aunt        breast dx'ed in 47s     Allergies  Allergen Reactions  . Sulfa Antibiotics Hives  . Sulfur     Current Outpatient Medications on File Prior to Visit    Medication Sig Dispense Refill  . calcium citrate-vitamin D (CITRACAL+D) 315-200 MG-UNIT tablet Take 1 tablet by mouth 2 (two) times daily.    . Multiple Vitamins-Minerals (ALIVE ONCE DAILY WOMENS PO) Take by mouth.     No current facility-administered medications on file prior to visit.     BP 110/82 (BP Location: Left Arm, Patient Position: Sitting, Cuff Size: Normal)   Pulse 69   Temp 98.2 F (36.8 C) (Oral)   Wt 149 lb 6 oz (67.8 kg)   LMP 11/02/2011   SpO2 98%   BMI 22.06 kg/m        Objective:   Physical Exam  Constitutional: She is oriented to person, place, and time. She appears well-developed and well-nourished.  HENT:  Right Ear: Hearing normal.  Left Ear: Hearing normal.  Bilateral cerumen impaction present. Bilateral cerumen removal through lavage completed and following removal, TMs WNL bilaterally. Patient reports muffled hearing resolved.  Hearing WNL following removal of cerumen.  Eyes: Pupils are equal, round, and reactive to light. No scleral icterus.  Neck: Neck supple.  Cardiovascular: Normal rate, regular rhythm and normal heart sounds.  Pulmonary/Chest: Effort normal and breath sounds normal. She has no wheezes. She has no rales.  Lymphadenopathy:    She has no cervical adenopathy.  Neurological: She is alert and oriented to person, place, and time.  Skin: Skin is warm and dry. No erythema.  Psychiatric: She has a normal mood and affect. Her behavior is normal. Judgment and thought content normal.      Assessment & Plan:  1. Bilateral impacted cerumen Cerumen removed via lavage. TMs WNL following removal of cerumen and hearing WNL. Advised avoidance of Q-tips and recommended ear wax softening drops that can be used as needed. Return precautions provided.  Roddie Mc, FNP-C

## 2017-12-23 NOTE — Progress Notes (Signed)
Bilateral ear irrigation performed.  Wax successfully removed without difficulty.  Patient tolerated procedure well.

## 2017-12-23 NOTE — Patient Instructions (Addendum)
Please avoid use of Q-tips.  You can use drops to soften wax.   Debrox is one option that can be considered.  If symptoms return, follow up as needed.   Earwax Buildup, Adult The ears produce a substance called earwax that helps keep bacteria out of the ear and protects the skin in the ear canal. Occasionally, earwax can build up in the ear and cause discomfort or hearing loss. What increases the risk? This condition is more likely to develop in people who:  Are female.  Are elderly.  Naturally produce more earwax.  Clean their ears often with cotton swabs.  Use earplugs often.  Use in-ear headphones often.  Wear hearing aids.  Have narrow ear canals.  Have earwax that is overly thick or sticky.  Have eczema.  Are dehydrated.  Have excess hair in the ear canal.  What are the signs or symptoms? Symptoms of this condition include:  Reduced or muffled hearing.  A feeling of fullness in the ear or feeling that the ear is plugged.  Fluid coming from the ear.  Ear pain.  Ear itch.  Ringing in the ear.  Coughing.  An obvious piece of earwax that can be seen inside the ear canal.  How is this diagnosed? This condition may be diagnosed based on:  Your symptoms.  Your medical history.  An ear exam. During the exam, your health care provider will look into your ear with an instrument called an otoscope.  You may have tests, including a hearing test. How is this treated? This condition may be treated by:  Using ear drops to soften the earwax.  Having the earwax removed by a health care provider. The health care provider may: ? Flush the ear with water. ? Use an instrument that has a loop on the end (curette). ? Use a suction device.  Surgery to remove the wax buildup. This may be done in severe cases.  Follow these instructions at home:  Take over-the-counter and prescription medicines only as told by your health care provider.  Do not put any  objects, including cotton swabs, into your ear. You can clean the opening of your ear canal with a washcloth or facial tissue.  Follow instructions from your health care provider about cleaning your ears. Do not over-clean your ears.  Drink enough fluid to keep your urine clear or pale yellow. This will help to thin the earwax.  Keep all follow-up visits as told by your health care provider. If earwax builds up in your ears often or if you use hearing aids, consider seeing your health care provider for routine, preventive ear cleanings. Ask your health care provider how often you should schedule your cleanings.  If you have hearing aids, clean them according to instructions from the manufacturer and your health care provider. Contact a health care provider if:  You have ear pain.  You develop a fever.  You have blood, pus, or other fluid coming from your ear.  You have hearing loss.  You have ringing in your ears that does not go away.  Your symptoms do not improve with treatment.  You feel like the room is spinning (vertigo). Summary  Earwax can build up in the ear and cause discomfort or hearing loss.  The most common symptoms of this condition include reduced or muffled hearing and a feeling of fullness in the ear or feeling that the ear is plugged.  This condition may be diagnosed based on your symptoms, your  medical history, and an ear exam.  This condition may be treated by using ear drops to soften the earwax or by having the earwax removed by a health care provider.  Do not put any objects, including cotton swabs, into your ear. You can clean the opening of your ear canal with a washcloth or facial tissue. This information is not intended to replace advice given to you by your health care provider. Make sure you discuss any questions you have with your health care provider. Document Released: 08/15/2004 Document Revised: 09/18/2016 Document Reviewed: 09/18/2016 Elsevier  Interactive Patient Education  [employer]2018 Elsevier Inc.

## 2017-12-24 ENCOUNTER — Ambulatory Visit (INDEPENDENT_AMBULATORY_CARE_PROVIDER_SITE_OTHER): Payer: Managed Care, Other (non HMO)

## 2017-12-24 DIAGNOSIS — Z23 Encounter for immunization: Secondary | ICD-10-CM | POA: Diagnosis not present

## 2017-12-24 DIAGNOSIS — E538 Deficiency of other specified B group vitamins: Secondary | ICD-10-CM

## 2017-12-24 MED ORDER — CYANOCOBALAMIN 1000 MCG/ML IJ SOLN: 1000.0000 ug | Freq: Once | INTRAMUSCULAR | Status: DC

## 2017-12-24 NOTE — Progress Notes (Addendum)
Patient presented for Hep B injection to left deltoid, patient voiced no concerns nor showed any signs of distress during injection. Patient was schedule 2 months past the 3 of 3 injection by mistake. Was informed By Dr. Darrick Huntsmanullo that she was ok to still get her third Hep B vaccine without starting over.

## 2017-12-24 NOTE — Addendum Note (Signed)
Addended by: Elise BenneBOOTH, Pearson Reasons T on: 12/24/2017 04:19 PM   Modules accepted: Orders

## 2018-07-13 ENCOUNTER — Other Ambulatory Visit: Payer: Self-pay | Admitting: Internal Medicine

## 2018-07-13 DIAGNOSIS — Z1231 Encounter for screening mammogram for malignant neoplasm of breast: Secondary | ICD-10-CM

## 2018-08-07 ENCOUNTER — Encounter: Payer: Managed Care, Other (non HMO) | Admitting: Internal Medicine

## 2018-08-10 ENCOUNTER — Encounter: Payer: Self-pay | Admitting: General Practice

## 2018-08-10 ENCOUNTER — Ambulatory Visit
Admission: RE | Admit: 2018-08-10 | Discharge: 2018-08-10 | Disposition: A | Discharge status: Home / Self Care | Payer: BC Managed Care – PPO | Source: Ambulatory Visit | Attending: Internal Medicine | Admitting: Internal Medicine

## 2018-08-10 DIAGNOSIS — Z1231 Encounter for screening mammogram for malignant neoplasm of breast: Secondary | ICD-10-CM | POA: Insufficient documentation

## 2018-08-10 LAB — HM MAMMOGRAPHY

## 2018-08-14 ENCOUNTER — Telehealth: Payer: Self-pay | Admitting: Internal Medicine

## 2018-08-14 NOTE — Telephone Encounter (Signed)
Results given and documented in result note. 

## 2018-08-14 NOTE — Telephone Encounter (Signed)
Copied from CRM [id number]. Topic: Quick Communication - See Telephone Encounter >> Aug 14, 2018 12:28 PM Jens Som A wrote: CRM for notification. See Telephone encounter for: 08/14/18.  Patient is calling back for lab results please advise [phone]

## 2018-08-20 ENCOUNTER — Encounter: Payer: Self-pay | Admitting: Internal Medicine

## 2018-08-20 ENCOUNTER — Ambulatory Visit (INDEPENDENT_AMBULATORY_CARE_PROVIDER_SITE_OTHER): Payer: BC Managed Care – PPO | Admitting: Internal Medicine

## 2018-08-20 VITALS — BP 122/76 | HR 63 | Temp 98.4°F | Ht 69.0 in | Wt 157.6 lb

## 2018-08-20 DIAGNOSIS — H547 Unspecified visual loss: Secondary | ICD-10-CM

## 2018-08-20 DIAGNOSIS — Z1389 Encounter for screening for other disorder: Secondary | ICD-10-CM | POA: Diagnosis not present

## 2018-08-20 DIAGNOSIS — J329 Chronic sinusitis, unspecified: Secondary | ICD-10-CM | POA: Diagnosis not present

## 2018-08-20 DIAGNOSIS — Z0184 Encounter for antibody response examination: Secondary | ICD-10-CM

## 2018-08-20 DIAGNOSIS — M21619 Bunion of unspecified foot: Secondary | ICD-10-CM

## 2018-08-20 DIAGNOSIS — Z0000 Encounter for general adult medical examination without abnormal findings: Secondary | ICD-10-CM | POA: Diagnosis not present

## 2018-08-20 DIAGNOSIS — Z13220 Encounter for screening for lipoid disorders: Secondary | ICD-10-CM

## 2018-08-20 DIAGNOSIS — Z1329 Encounter for screening for other suspected endocrine disorder: Secondary | ICD-10-CM

## 2018-08-20 DIAGNOSIS — Z1159 Encounter for screening for other viral diseases: Secondary | ICD-10-CM

## 2018-08-20 DIAGNOSIS — E559 Vitamin D deficiency, unspecified: Secondary | ICD-10-CM

## 2018-08-20 MED ORDER — AZITHROMYCIN 250 MG PO TABS: ORAL_TABLET | ORAL | 0 refills | Status: DC

## 2018-08-20 NOTE — Patient Instructions (Signed)
Try zyrtec at night as needed  Flonase 2 sprays max  Tea honey, lemon, ginger Garlic   Dr. Fae Pippin Evans/Dr. Al Corpus foot MD   Bunion  A bunion is a bump on the base of the big toe that forms when the bones of the big toe joint move out of position. Bunions may be small at first, but they often get larger over time. They can make walking painful. What are the causes? A bunion may be caused by:  Wearing narrow or pointed shoes that force the big toe to press against the other toes.  Abnormal foot development that causes the foot to roll inward (pronate).  Changes in the foot that are caused by certain diseases, such as rheumatoid arthritis or polio.  A foot injury. What increases the risk? The following factors may make you more likely to develop this condition:  Wearing shoes that squeeze the toes together.  Having certain diseases, such as: ? Rheumatoid arthritis. ? Polio. ? Cerebral palsy.  Having family members who have bunions.  Being born with a foot deformity, such as flat feet or low arches.  Doing activities that put a lot of pressure on the feet, such as ballet dancing. What are the signs or symptoms? The main symptom of a bunion is a noticeable bump on the big toe. Other symptoms may include:  Pain.  Swelling around the big toe.  Redness and inflammation.  Thick or hardened skin on the big toe or between the toes.  Stiffness or loss of motion in the big toe.  Trouble with walking. How is this diagnosed? A bunion may be diagnosed based on your symptoms, medical history, and activities. You may have tests, such as:  X-rays. These allow your health care provider to check the position of the bones in your foot and look for damage to your joint. They also help your health care provider determine the severity of your bunion and the best way to treat it.  Joint aspiration. In this test, a sample of fluid is removed from the toe joint. This test may be done if you  are in a lot of pain. It helps rule out diseases that cause painful swelling of the joints, such as arthritis. How is this treated? Treatment depends on the severity of your symptoms. The goal of treatment is to relieve symptoms and prevent the bunion from getting worse. Your health care provider may recommend:  Wearing shoes that have a wide toe box.  Using bunion pads to cushion the affected area.  Taping your toes together to keep them in a normal position.  Placing a device inside your shoe (orthotics) to help reduce pressure on your toe joint.  Taking medicine to ease pain, inflammation, and swelling.  Applying heat or ice to the affected area.  Doing stretching exercises.  Surgery to remove scar tissue and move the toes back into their normal position. This treatment is rare. Follow these instructions at home: Managing pain, stiffness, and swelling   If directed, put ice on the painful area: ? Put ice in a plastic bag. ? Place a towel between your skin and the bag. ? Leave the ice on for 20 minutes, 2-3 times a day. Activity   If directed, apply heat to the affected area before you exercise. Use the heat source that your health care provider recommends, such as a moist heat pack or a heating pad. ? Place a towel between your skin and the heat source. ? Leave  the heat on for 20-30 minutes. ? Remove the heat if your skin turns bright red. This is especially important if you are unable to feel pain, heat, or cold. You may have a greater risk of getting burned.  Do exercises as told by your health care provider. General instructions  Support your toe joint with proper footwear, shoe padding, or taping as told by your health care provider.  Take over-the-counter and prescription medicines only as told by your health care provider.  Keep all follow-up visits as told by your health care provider. This is important. Contact a health care provider if your symptoms:  Get  worse.  Do not improve in 2 weeks. Get help right away if you have:  Severe pain and trouble with walking. Summary  A bunion is a bump on the base of the big toe that forms when the bones of the big toe joint move out of position.  Bunions can make walking painful.  Treatment depends on the severity of your symptoms.  Support your toe joint with proper footwear, shoe padding, or taping as told by your health care provider. This information is not intended to replace advice given to you by your health care provider. Make sure you discuss any questions you have with your health care provider. Document Released: 07/08/2005 Document Revised: 11/18/2017 Document Reviewed: 11/18/2017 Elsevier Interactive Patient Education  2019 [employer].

## 2018-08-20 NOTE — Progress Notes (Signed)
Chief Complaint  Patient presents with  . Annual Exam   Annual  1. 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 Wants referral to eye and foot MD    Review of Systems  Constitutional: Negative for weight loss.  HENT: Positive for sinus pain. Negative for hearing loss.        +PND  Eyes: Negative for blurred vision.  Respiratory: Negative for shortness of breath.   Cardiovascular: Negative for chest pain.  Gastrointestinal: Negative for abdominal pain.  Musculoskeletal:       Bunions   Skin: Negative for rash.  Neurological: Negative for headaches.  Psychiatric/Behavioral: Negative for depression.   Past Medical History:  Diagnosis Date  . Back pain    low back pain   . Fibrocystic breast   . History of fainting spells of unknown cause   . Infertility associated with anovulation   . Osteopenia   . 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 Premature ovarian failure    Past Surgical History:  Procedure Laterality Date  . BREAST BIOPSY Left    age 64's- neg  . BREAST LUMPECTOMY Left 1999  . BREAST LUMPECTOMY     benign tumor   . BREAST SURGERY     Family History  Problem Relation Age of Onset  . Diabetes Mother   . Heart disease Mother   . Hypertension Mother   . Fibromyalgia Mother   . Other Mother        cardiac arrythmia   . Mental illness Father        bipolar   . Hypertension Father   . Hyperlipidemia Father   . Diabetes Father   . Kidney disease Father   . Bradycardia Father   . Breast cancer Paternal Aunt 66  . Cancer Paternal Aunt        breast dx'ed in 53s    Social History   Socioeconomic History  . Marital status: Married    Spouse name: Not on file  . Number of children: Not on file  . Years of education: Not on file  . Highest education level: Not on file  Occupational History   . Not on file  Social Needs  . Financial resource strain: Not on file  . Food insecurity:    Worry: Not on file    Inability: Not on file  . Transportation needs:    Medical: Not on file    Non-medical: Not on file  Tobacco Use  . Smoking status: Never Smoker  . Smokeless tobacco: Never Used  Substance and Sexual Activity  . Alcohol use: Yes    Alcohol/week: 1.0 standard drinks    Types: 1 Standard drinks or equivalent per week  . Drug use: No  . Sexual activity: Yes    Partners: Male  Lifestyle  . Physical activity:    Days per week: Not on file    Minutes per session: Not on file  . Stress: Not on file  Relationships  . Social connections:    Talks on phone: Not on file    Gets together: Not on file    Attends religious service: Not on file    Active member of club or organization: Not on file    Attends meetings of clubs or organizations: Not on file  Relationship status: Not on file  . Intimate partner violence:    Fear of current or ex partner: No    Emotionally abused: Not on file    Physically abused: Not on file    Forced sexual activity: Not on file  Other Topics Concern  . 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 calcium citrate-vitamin D (CITRACAL+D) 315-200 MG-UNIT tablet Take 1 tablet by mouth 2 (two) times daily.  . Multiple Vitamins-Minerals (ALIVE ONCE DAILY WOMENS PO) Take by mouth.   Allergies  Allergen Reactions  . Sulfa Antibiotics Hives  . Sulfur    No results found for this or any previous visit (from the past 2160 hour(s)). Objective  Body mass index is 23.27 kg/m. Wt Readings from Last 3 Encounters:  08/20/18 157 lb 9.6 oz (71.5 kg)  12/23/17 149 lb 6 oz (67.8  kg)  06/10/17 153 lb (69.4 kg)   Temp Readings from Last 3 Encounters:  08/20/18 98.4 F (36.9 C) (Oral)  12/23/17 98.2 F (36.8 C) (Oral)  06/10/17 98 F (36.7 C) (Oral)   BP Readings from Last 3 Encounters:  08/20/18 122/76  12/23/17 110/82  06/10/17 98/66   Pulse Readings from Last 3 Encounters:  08/20/18 63  12/23/17 69  06/10/17 67    Physical Exam Vitals signs and nursing note reviewed.  Constitutional:      Appearance: Normal appearance. She is well-developed and well-groomed.  HENT:     Head: Normocephalic and atraumatic.     Nose: Nose normal.     Mouth/Throat:     Mouth: Mucous membranes are moist.     Pharynx: Oropharynx is clear.  Eyes:     Conjunctiva/sclera: Conjunctivae normal.     Pupils: Pupils are equal, round, and reactive to light.  Cardiovascular:     Rate and Rhythm: Normal rate and regular rhythm.     Heart sounds: Normal heart sounds. No murmur.  Pulmonary:     Effort: Pulmonary effort is normal.     Breath sounds: Normal breath sounds.  Skin:    General: Skin is warm and dry.  Neurological:     General: No focal deficit present.     Mental Status: She is alert and oriented to person, place, and time. Mental status is at baseline.     Gait: Gait normal.  Psychiatric:        Attention and Perception: Attention and perception normal.        Mood and Affect: Mood and affect normal.        Speech: Speech normal.        Behavior: Behavior normal. Behavior is cooperative.        Thought Content: Thought content normal.        Cognition and Memory: Cognition and memory normal.        Judgment: Judgment normal.     Assessment   1. Annual  2. URI/sinusitis possibly resolving viral illness I.e flu Plan   1.  sch fasting labs  rec healthy diet and exercise  mammo 08/10/18 normal  Pap PFW 08/2018 in [place]  Declines flu shot  Check hep B and MMR status  2. [facility] supportive care   Provider: Dr. Olivia Mackie McLean-Scocuzza-Internal Medicine

## 2018-08-20 NOTE — Progress Notes (Signed)
Pre visit review using our clinic review tool, if applicable. No additional management support is needed unless otherwise documented below in the visit note. 

## 2018-08-21 ENCOUNTER — Other Ambulatory Visit (INDEPENDENT_AMBULATORY_CARE_PROVIDER_SITE_OTHER): Payer: BC Managed Care – PPO

## 2018-08-21 DIAGNOSIS — Z1389 Encounter for screening for other disorder: Secondary | ICD-10-CM

## 2018-08-21 DIAGNOSIS — E559 Vitamin D deficiency, unspecified: Secondary | ICD-10-CM

## 2018-08-21 DIAGNOSIS — Z1159 Encounter for screening for other viral diseases: Secondary | ICD-10-CM

## 2018-08-21 DIAGNOSIS — Z0000 Encounter for general adult medical examination without abnormal findings: Secondary | ICD-10-CM | POA: Diagnosis not present

## 2018-08-21 DIAGNOSIS — Z1329 Encounter for screening for other suspected endocrine disorder: Secondary | ICD-10-CM

## 2018-08-21 DIAGNOSIS — Z13220 Encounter for screening for lipoid disorders: Secondary | ICD-10-CM | POA: Diagnosis not present

## 2018-08-21 DIAGNOSIS — Z0184 Encounter for antibody response examination: Secondary | ICD-10-CM

## 2018-08-21 LAB — CBC WITH DIFFERENTIAL/PLATELET
BASOS ABS: 0.1 10*3/uL (ref 0.0–0.1)
Basophils Relative: 1.1 % (ref 0.0–3.0)
Eosinophils Absolute: 0.1 10*3/uL (ref 0.0–0.7)
Eosinophils Relative: 1.2 % (ref 0.0–5.0)
HCT: 37 % (ref 36.0–46.0)
Hemoglobin: 12.4 g/dL (ref 12.0–15.0)
Lymphocytes Relative: 34.3 % (ref 12.0–46.0)
Lymphs Abs: 1.7 10*3/uL (ref 0.7–4.0)
MCHC: 33.6 g/dL (ref 30.0–36.0)
MCV: 96.3 fl (ref 78.0–100.0)
MONOS PCT: 8.1 % (ref 3.0–12.0)
Monocytes Absolute: 0.4 10*3/uL (ref 0.1–1.0)
Neutro Abs: 2.8 10*3/uL (ref 1.4–7.7)
Neutrophils Relative %: 55.3 % (ref 43.0–77.0)
Platelets: 310 10*3/uL (ref 150.0–400.0)
RBC: 3.85 Mil/uL — ABNORMAL LOW (ref 3.87–5.11)
RDW: 13.5 % (ref 11.5–15.5)
WBC: 5 10*3/uL (ref 4.0–10.5)

## 2018-08-21 LAB — LIPID PANEL
CHOL/HDL RATIO: 2
Cholesterol: 172 mg/dL (ref 0–200)
HDL: 78.2 mg/dL (ref >39.00)
LDL Cholesterol: 88 mg/dL (ref 0–99)
NonHDL: 93.62
Triglycerides: 30 mg/dL (ref 0.0–149.0)
VLDL: 6 mg/dL (ref 0.0–40.0)

## 2018-08-21 LAB — COMPREHENSIVE METABOLIC PANEL
ALT: 13 U/L (ref 0–35)
AST: 20 U/L (ref 0–37)
Albumin: 4.2 g/dL (ref 3.5–5.2)
Alkaline Phosphatase: 43 U/L (ref 39–117)
BUN: 12 mg/dL (ref 6–23)
CO2: 27 mEq/L (ref 19–32)
Calcium: 9.4 mg/dL (ref 8.4–10.5)
Chloride: 104 mEq/L (ref 96–112)
Creatinine, Ser: 0.79 mg/dL (ref 0.40–1.20)
GFR: 95.56 mL/min (ref >60.00)
Glucose, Bld: 83 mg/dL (ref 70–99)
Potassium: 4.1 mEq/L (ref 3.5–5.1)
Sodium: 138 mEq/L (ref 135–145)
Total Bilirubin: 0.4 mg/dL (ref 0.2–1.2)
Total Protein: 7.3 g/dL (ref 6.0–8.3)

## 2018-08-21 LAB — T4, FREE: Free T4: 0.77 ng/dL (ref 0.60–1.60)

## 2018-08-21 LAB — VITAMIN D 25 HYDROXY (VIT D DEFICIENCY, FRACTURES): VITD: 31.61 ng/mL (ref 30.00–100.00)

## 2018-08-21 LAB — TSH: TSH: 1.23 u[IU]/mL (ref 0.35–4.50)

## 2018-08-21 NOTE — Addendum Note (Signed)
Addended by: Penne Lash on: 08/21/2018 08:15 AM   Modules accepted: Orders

## 2018-08-22 LAB — URINALYSIS, ROUTINE W REFLEX MICROSCOPIC
Bilirubin, UA: NEGATIVE
Glucose, UA: NEGATIVE
Ketones, UA: NEGATIVE
Leukocytes, UA: NEGATIVE
Nitrite, UA: NEGATIVE
PROTEIN UA: NEGATIVE
RBC, UA: NEGATIVE
Specific Gravity, UA: 1.015 (ref 1.005–1.030)
Urobilinogen, Ur: 0.2 mg/dL (ref 0.2–1.0)
pH, UA: 6 (ref 5.0–7.5)

## 2018-08-24 ENCOUNTER — Encounter: Payer: Self-pay | Admitting: *Deleted

## 2018-08-24 LAB — MEASLES/MUMPS/RUBELLA IMMUNITY
Mumps IgG: 62.1 AU/mL
Rubella: 4.8 index
Rubeola IgG: <13.5 AU/mL — ABNORMAL LOW

## 2018-08-24 LAB — HEPATITIS B SURFACE ANTIBODY, QUANTITATIVE: Hep B S AB Quant (Post): >1000 m[IU]/mL (ref >=10)

## 2018-09-10 ENCOUNTER — Encounter: Payer: Self-pay | Admitting: General Practice

## 2018-09-10 LAB — HM DEXA SCAN

## 2018-09-21 ENCOUNTER — Ambulatory Visit (INDEPENDENT_AMBULATORY_CARE_PROVIDER_SITE_OTHER): Payer: Managed Care, Other (non HMO)

## 2018-09-21 ENCOUNTER — Encounter: Payer: Self-pay | Admitting: Podiatry

## 2018-09-21 ENCOUNTER — Ambulatory Visit: Payer: Managed Care, Other (non HMO) | Admitting: Podiatry

## 2018-09-21 VITALS — BP 120/73 | HR 70 | Resp 16

## 2018-09-21 DIAGNOSIS — M2011 Hallux valgus (acquired), right foot: Secondary | ICD-10-CM

## 2018-09-21 DIAGNOSIS — M2012 Hallux valgus (acquired), left foot: Secondary | ICD-10-CM

## 2018-09-21 DIAGNOSIS — Q828 Other specified congenital malformations of skin: Secondary | ICD-10-CM

## 2018-09-22 NOTE — Progress Notes (Signed)
  Subjective:  Patient ID: Chelsea Wagner, female    DOB: 1976/01/06,  MRN: [id number] HPI Chief Complaint  Patient presents with  . Foot Pain    1st MPJ bilateral (L>R) - aching x years, active in dance herself for years, but she is still an Secondary school teacher as well, shoes are starting to become uncomfortable, callused areas too  . New Patient (Initial Visit)    43 y.o. female presents with the above complaint.   ROS: Denies fever chills nausea vomiting muscle aches pains calf pain back pain chest pain shortness of breath.  Past Medical History:  Diagnosis Date  . Back pain    low back pain   . Fibrocystic breast   . History of fainting spells of unknown cause   . Infertility associated with anovulation   . Osteopenia   . 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 Premature ovarian failure    Past Surgical History:  Procedure Laterality Date  . BREAST BIOPSY Left    age 14's- neg  . BREAST LUMPECTOMY Left 1999  . BREAST LUMPECTOMY     benign tumor   . BREAST SURGERY      Current Outpatient Medications:  .  calcium citrate-vitamin D (CITRACAL+D) 315-200 MG-UNIT tablet, Take 1 tablet by mouth 2 (two) times daily., Disp: , Rfl:  .  Multiple Vitamins-Minerals (ALIVE ONCE DAILY WOMENS PO), Take by mouth., Disp: , Rfl:   Allergies  Allergen Reactions  . Sulfa Antibiotics Hives  . Sulfur    Review of Systems Objective:   Vitals:   09/21/18 1629  BP: 120/73  Pulse: 70  Resp: 16    General: Well developed, nourished, in no acute distress, alert and oriented x3   Dermatological: Skin is warm, dry and supple bilateral. Nails x 10 are well maintained; remaining integument appears unremarkable at this time. There are no open sores, no preulcerative lesions, no rash or signs of infection present.  Vascular: Dorsalis Pedis artery and Posterior Tibial artery pedal pulses are 2/4 bilateral with immedate capillary fill time. Pedal hair growth present. No varicosities and no lower extremity  edema present bilateral.   Neruologic: Grossly intact via light touch bilateral. Vibratory intact via tuning fork bilateral. Protective threshold with Semmes Wienstein monofilament intact to all pedal sites bilateral. Patellar and Achilles deep tendon reflexes 2+ bilateral. No Babinski or clonus noted bilateral.   Musculoskeletal: No gross boney pedal deformities bilateral. No pain, crepitus, or limitation noted with foot and ankle range of motion bilateral. Muscular strength 5/5 in all groups tested bilateral.  Hallux abductovalgus deformity bilateral tailor's bunion deformity bilateral porokeratotic reactive hyperkeratotic tissue.  Gait: Unassisted, Nonantalgic.    Radiographs:  Radiographs taken today demonstrate an increase in the first intermetatarsal angle greater than normal value consistent with hallux abductovalgus deformity she also has a long second metatarsal she also has an increase in the fifth metatarsal deviation angle.  Assessment & Plan:   Assessment: Hallux abductovalgus deformity bilateral pes planus flexible nature bilateral.  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  Plan: Discussed etiology pathology and surgical therapies debrided all reactive hyperkeratotic tissue today discussed surgery in great detail today she will consider doing this when she has time, but her schedule over the next several months possibly a year.     Max T. [place], [place]

## 2018-10-05 ENCOUNTER — Encounter: Payer: Self-pay | Admitting: General Practice

## 2019-01-06 ENCOUNTER — Ambulatory Visit (INDEPENDENT_AMBULATORY_CARE_PROVIDER_SITE_OTHER): Payer: Managed Care, Other (non HMO) | Admitting: Podiatry

## 2019-01-06 ENCOUNTER — Other Ambulatory Visit: Payer: Self-pay

## 2019-01-06 ENCOUNTER — Encounter: Payer: Self-pay | Admitting: Podiatry

## 2019-01-06 VITALS — Temp 97.1°F

## 2019-01-06 DIAGNOSIS — Q828 Other specified congenital malformations of skin: Secondary | ICD-10-CM

## 2019-01-06 NOTE — Progress Notes (Signed)
She presents today chief complaint of painful calluses hallux interphalangeal joint and sub-fifth bilaterally.  She denies fever chills nausea vomiting muscle aches pain states then go to have these trimmed off instead of having surgery at this time.  Objective: Vital signs are stable alert oriented x3.  Pulses are palpable.  Neurologic sensorium is intact.  Degenerative flexors are intact.  Reactive hyper keratomas sub-hallux interphalangeal joint and sub-fifth metatarsal bilaterally.  Assessment: Reactive hyper keratomas to bilateral.  Plan: Debrided reactive hyperkeratotic tissue discussed appropriate shoe gear.  Follow-up with me in a few months.

## 2019-01-13 ENCOUNTER — Other Ambulatory Visit: Payer: Self-pay

## 2019-01-13 ENCOUNTER — Ambulatory Visit (INDEPENDENT_AMBULATORY_CARE_PROVIDER_SITE_OTHER): Payer: BC Managed Care – PPO | Admitting: Orthotics

## 2019-01-13 DIAGNOSIS — M2012 Hallux valgus (acquired), left foot: Secondary | ICD-10-CM

## 2019-01-13 DIAGNOSIS — Q828 Other specified congenital malformations of skin: Secondary | ICD-10-CM

## 2019-01-13 DIAGNOSIS — M2011 Hallux valgus (acquired), right foot: Secondary | ICD-10-CM

## 2019-01-13 NOTE — Progress Notes (Addendum)
Patient came into today to be cast for Custom Foot Orthotics. Upon recommendation of Dr. Fuller Mandril Patient presents with porokeratosis, hav left Goals are arch support, offload 1/5 b/l Plan vendor

## 2019-02-03 ENCOUNTER — Ambulatory Visit: Payer: BC Managed Care – PPO | Admitting: Orthotics

## 2019-02-03 ENCOUNTER — Other Ambulatory Visit: Payer: Self-pay

## 2019-02-03 DIAGNOSIS — Q828 Other specified congenital malformations of skin: Secondary | ICD-10-CM

## 2019-02-03 DIAGNOSIS — M6788 Other specified disorders of synovium and tendon, other site: Secondary | ICD-10-CM

## 2019-02-03 NOTE — Progress Notes (Signed)
Patient came in today to pick up custom made foot orthotics.  The goals were accomplished and the patient reported no dissatisfaction with said orthotics.  Patient was advised of breakin period and how to report any issues. 

## 2019-02-17 ENCOUNTER — Other Ambulatory Visit: Payer: Self-pay

## 2019-02-17 ENCOUNTER — Telehealth: Payer: Self-pay | Admitting: Internal Medicine

## 2019-02-17 DIAGNOSIS — Z20822 Contact with and (suspected) exposure to covid-19: Secondary | ICD-10-CM

## 2019-02-17 NOTE — Telephone Encounter (Signed)
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 FYI

## 2019-02-18 LAB — NOVEL CORONAVIRUS, NAA: SARS-CoV-2, NAA: NOT DETECTED

## 2019-03-25 ENCOUNTER — Telehealth: Payer: Self-pay

## 2019-03-25 ENCOUNTER — Encounter: Payer: Self-pay | Admitting: Internal Medicine

## 2019-03-25 NOTE — Telephone Encounter (Signed)
Patient has been informed.

## 2019-03-25 NOTE — Telephone Encounter (Signed)
Copied from [place] [id number]. Topic: General - Call Back - No Documentation >> Mar 25, 2019 11:21 AM Erick Blinks wrote: Reason for CRM: Pt has forms that she would like for PCP to fill. Wants call back if appt is necessary. Two sets of forms -CDL license + [employer] contact: [phone]

## 2019-03-25 NOTE — Telephone Encounter (Signed)
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

## 2019-03-31 ENCOUNTER — Telehealth: Payer: Self-pay | Admitting: Internal Medicine

## 2019-03-31 NOTE — Telephone Encounter (Signed)
Pt dropped off medical evaluation form to be picked up. Brock placed on Dr. Audrie Gallus desk.

## 2019-04-07 ENCOUNTER — Ambulatory Visit: Payer: Managed Care, Other (non HMO) | Admitting: Podiatry

## 2019-04-13 ENCOUNTER — Other Ambulatory Visit: Payer: Self-pay | Admitting: Internal Medicine

## 2019-04-13 DIAGNOSIS — Z113 Encounter for screening for infections with a predominantly sexual mode of transmission: Secondary | ICD-10-CM

## 2019-04-13 DIAGNOSIS — Z0279 Encounter for issue of other medical certificate: Secondary | ICD-10-CM

## 2019-04-23 ENCOUNTER — Other Ambulatory Visit: Payer: Self-pay

## 2019-04-23 ENCOUNTER — Ambulatory Visit (INDEPENDENT_AMBULATORY_CARE_PROVIDER_SITE_OTHER): Payer: BC Managed Care – PPO | Admitting: Internal Medicine

## 2019-04-23 ENCOUNTER — Encounter: Payer: Self-pay | Admitting: Internal Medicine

## 2019-04-23 DIAGNOSIS — N3090 Cystitis, unspecified without hematuria: Secondary | ICD-10-CM | POA: Diagnosis not present

## 2019-04-23 DIAGNOSIS — N3000 Acute cystitis without hematuria: Secondary | ICD-10-CM | POA: Insufficient documentation

## 2019-04-23 MED ORDER — CIPROFLOXACIN HCL 250 MG PO TABS: 250.0000 mg | ORAL_TABLET | Freq: Two times a day (BID) | ORAL | 0 refills | Status: DC

## 2019-04-23 NOTE — Addendum Note (Signed)
Addended by: Adair Laundry on: 04/23/2019 01:52 PM   Modules accepted: Orders

## 2019-04-23 NOTE — Assessment & Plan Note (Signed)
Will treat empirically with Ciprofloxacin.  UA during school CPE was supportive of infection.  Advised to take a probiotic  For 3 weeks.  If symptoms do not resolve  Will need to submit urine for culture.

## 2019-04-23 NOTE — Progress Notes (Signed)
Virtual Visit via [email or web address]  This visit type was conducted due to national recommendations for restrictions regarding the COVID-19 pandemic (e.g. social distancing).  This format is felt to be most appropriate for this patient at this time.  All issues noted in this document were discussed and addressed.  No physical exam was performed (except for noted visual exam findings with Video Visits).   I connected with@ on 04/23/19 at  8:00 AM EDT by a video enabled telemedicine application or telephone and verified that I am speaking with the correct person using two identifiers. Location patient: home Location provider: work or home office Persons participating in the virtual visit: patient, provider  I discussed the limitations, risks, security and privacy concerns of performing an evaluation and management service by telephone and the availability of in person appointments. I also discussed with the patient that there may be a patient responsible charge related to this service. The patient expressed understanding and agreed to proceed.   Reason for visit: frequent urination,  Suprapubic pain, frequency    HPI:  43 yr old female presents with 3 day history of suprapubic pain and urinary frequency with dysuria.  Had a CPE for work several days ago and UA showed leukocytes.  No recent swimming,  But is sexually active with husband and has been working out more,  Spent Saturday in workout clothes for most of the day . Denies flank pain, fevers,  Nausea .  No vaginal discharge or itching    ROS: See pertinent positives and negatives per HPI.  Past Medical History:  Diagnosis Date  . Back pain    low back pain   . Fibrocystic breast   . History of fainting spells of unknown cause   . Infertility associated with anovulation   . Osteopenia   . 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 Premature ovarian failure     Past Surgical History:  Procedure Laterality Date  . BREAST BIOPSY Left    age 47's- neg  .  BREAST LUMPECTOMY Left 1999  . BREAST LUMPECTOMY     benign tumor   . BREAST SURGERY      Family History  Problem Relation Age of Onset  . Diabetes Mother   . Heart disease Mother   . Hypertension Mother   . Fibromyalgia Mother   . Other Mother        cardiac arrythmia   . Mental illness Father        bipolar   . Hypertension Father   . Hyperlipidemia Father   . Diabetes Father   . Kidney disease Father   . Bradycardia Father   . Breast cancer Paternal Aunt 79  . Cancer Paternal Aunt        breast dx'ed in 63s     SOCIAL HX:  reports that she has never smoked. She has never used smokeless tobacco. She reports current alcohol use of about 1.0 standard drinks of alcohol per week. She reports that she does not use drugs.   Current Outpatient Medications:  .  calcium citrate-vitamin D (CITRACAL+D) 315-200 MG-UNIT tablet, Take 1 tablet by mouth 2 (two) times daily., Disp: , Rfl:  .  Multiple Vitamins-Minerals (ALIVE ONCE DAILY WOMENS PO), Take by mouth., Disp: , Rfl:  .  ciprofloxacin (CIPRO) 250 MG tablet, Take 1 tablet (250 mg total) by mouth 2 (two) times daily., Disp: 6 tablet, Rfl: 0  EXAM:  VITALS per patient if applicable:  GENERAL: alert, oriented, appears well and in  no acute distress  HEENT: atraumatic, conjunttiva clear, no obvious abnormalities on inspection of external nose and ears  NECK: normal movements of the head and neck  LUNGS: on inspection no signs of respiratory distress, breathing rate appears normal, no obvious gross SOB, gasping or wheezing  CV: no obvious cyanosis  MS: moves all visible extremities without noticeable abnormality  PSYCH/NEURO: pleasant and cooperative, no obvious depression or anxiety, speech and thought processing grossly intact  ASSESSMENT AND PLAN:  Discussed the following assessment and plan:  Cystitis  Cystitis Will treat empirically with Ciprofloxacin.  UA during school CPE was supportive of infection.  Advised to  take a probiotic  For 3 weeks.  If symptoms do not resolve  Will need to submit urine for culture.     I discussed the assessment and treatment plan with the patient. The patient was provided an opportunity to ask questions and all were answered. The patient agreed with the plan and demonstrated an understanding of the instructions.   The patient was advised to call back or seek an in-person evaluation if the symptoms worsen or if the condition fails to improve as anticipated.  I provided  15 minutes of non-face-to-face time during this encounter reviewing patient's current problems and post surgeries.  Providing counseling on the above mentioned problems , and coordination  of care .   Crecencio Mc, MD

## 2019-05-27 ENCOUNTER — Other Ambulatory Visit: Payer: Self-pay

## 2019-05-27 DIAGNOSIS — Z20822 Contact with and (suspected) exposure to covid-19: Secondary | ICD-10-CM

## 2019-05-28 LAB — NOVEL CORONAVIRUS, NAA: SARS-CoV-2, NAA: NOT DETECTED

## 2019-07-12 ENCOUNTER — Other Ambulatory Visit: Payer: Self-pay | Admitting: Internal Medicine

## 2019-07-12 DIAGNOSIS — Z1231 Encounter for screening mammogram for malignant neoplasm of breast: Secondary | ICD-10-CM

## 2019-07-19 ENCOUNTER — Encounter: Payer: Self-pay | Admitting: Podiatry

## 2019-07-19 NOTE — Telephone Encounter (Signed)
lvm for pt that a message was left by [place] office that the inserts and shoe was in. Shoe and inserts should be at the office ok to pick up.

## 2019-07-20 ENCOUNTER — Telehealth: Payer: Self-pay | Admitting: Podiatry

## 2019-07-20 NOTE — Telephone Encounter (Signed)
Pt left message yesterday stating she returned her orthotics to be redone and forgot about them and never heard anything from our office about picking them up..  I returned call today and left message that they are in [place] and she can stop in to pick up.

## 2019-08-12 ENCOUNTER — Ambulatory Visit
Admission: RE | Admit: 2019-08-12 | Discharge: 2019-08-12 | Disposition: A | Discharge status: Home / Self Care | Payer: BC Managed Care – PPO | Source: Ambulatory Visit | Attending: Internal Medicine | Admitting: Internal Medicine

## 2019-08-12 DIAGNOSIS — Z1231 Encounter for screening mammogram for malignant neoplasm of breast: Secondary | ICD-10-CM | POA: Insufficient documentation

## 2019-08-23 ENCOUNTER — Other Ambulatory Visit: Payer: BC Managed Care – PPO

## 2019-08-23 ENCOUNTER — Ambulatory Visit: Payer: BC Managed Care – PPO | Attending: Internal Medicine

## 2019-08-23 DIAGNOSIS — Z20822 Contact with and (suspected) exposure to covid-19: Secondary | ICD-10-CM

## 2019-08-24 LAB — NOVEL CORONAVIRUS, NAA: SARS-CoV-2, NAA: NOT DETECTED

## 2019-08-25 ENCOUNTER — Other Ambulatory Visit: Payer: Self-pay

## 2019-08-25 ENCOUNTER — Encounter: Payer: Self-pay | Admitting: Internal Medicine

## 2019-08-25 ENCOUNTER — Ambulatory Visit: Payer: BC Managed Care – PPO | Admitting: Internal Medicine

## 2019-08-25 ENCOUNTER — Telehealth: Payer: Self-pay | Admitting: Internal Medicine

## 2019-08-25 VITALS — Ht 69.0 in | Wt 157.0 lb

## 2019-08-25 DIAGNOSIS — Z0000 Encounter for general adult medical examination without abnormal findings: Secondary | ICD-10-CM

## 2019-08-25 DIAGNOSIS — N62 Hypertrophy of breast: Secondary | ICD-10-CM

## 2019-08-25 DIAGNOSIS — Z1329 Encounter for screening for other suspected endocrine disorder: Secondary | ICD-10-CM

## 2019-08-25 DIAGNOSIS — Z13220 Encounter for screening for lipoid disorders: Secondary | ICD-10-CM

## 2019-08-25 DIAGNOSIS — Z1389 Encounter for screening for other disorder: Secondary | ICD-10-CM

## 2019-08-25 NOTE — Patient Instructions (Addendum)
Strontium [phone] mg daily total   Osteopenia   Osteopenia is a loss of thickness (density) inside of the bones. Another name for osteopenia is low bone mass. Mild osteopenia is a normal part of aging. It is not a disease, and it does not cause symptoms. However, if you have osteopenia and continue to lose bone mass, you could develop a condition that causes the bones to become thin and break more easily (osteoporosis). You may also lose some height, have back pain, and have a stooped posture. Although osteopenia is not a disease, making changes to your lifestyle and diet can help to prevent osteopenia from developing into osteoporosis. What are the causes? Osteopenia is caused by loss of calcium in the bones.  Bones are constantly changing. Old bone cells are continually being replaced with new bone cells. This process builds new bone. The mineral calcium is needed to build new bone and maintain bone density. Bone density is usually highest around age 60. After that, most people's bodies cannot replace all the bone they have lost with new bone. What increases the risk? You are more likely to develop this condition if:  You are older than age 32.  You are a woman who went through menopause early.  You have a long illness that keeps you in bed.  You do not get enough exercise.  You lack certain nutrients (malnutrition).  You have an overactive thyroid gland (hyperthyroidism).  You smoke.  You drink a lot of alcohol.  You are taking medicines that weaken the bones, such as steroids. What are the signs or symptoms? This condition does not cause any symptoms. You may have a slightly higher risk for bone breaks (fractures), so getting fractures more easily than normal may be an indication of osteopenia. How is this diagnosed? Your health care provider can diagnose this condition with a special type of X-ray exam that measures bone density (dual-energy X-ray absorptiometry, DEXA). This  test can measure bone density in your hips, spine, and wrists. Osteopenia has no symptoms, so this condition is usually diagnosed after a routine bone density screening test is done for osteoporosis. This routine screening is usually done for:  Women who are age 26 or older.  Men who are age 106 or older. If you have risk factors for osteopenia, you may have the screening test at an earlier age. How is this treated? Making dietary and lifestyle changes can lower your risk for osteoporosis. If you have severe osteopenia that is close to becoming osteoporosis, your health care provider may prescribe medicines and dietary supplements such as calcium and vitamin D. These supplements help to rebuild bone density. Follow these instructions at home:   Take over-the-counter and prescription medicines only as told by your health care provider. These include vitamins and supplements.  Eat a diet that is high in calcium and vitamin D. ? Calcium is found in dairy products, beans, salmon, and leafy green vegetables like spinach and broccoli. ? Look for foods that have vitamin D and calcium added to them (fortified foods), such as orange juice, cereal, and bread.  Do 30 or more minutes of a weight-bearing exercise every day, such as walking, jogging, or playing a sport. These types of exercises strengthen the bones.  Take precautions at home to lower your risk of falling, such as: ? Keeping rooms well-lit and free of clutter, such as cords. ? Installing safety rails on stairs. ? Using rubber mats in the bathroom or other areas that are  often wet or slippery.  Do not use any products that contain nicotine or tobacco, such as cigarettes and e-cigarettes. If you need help quitting, ask your health care provider.  Avoid alcohol or limit alcohol intake to no more than 1 drink a day for nonpregnant women and 2 drinks a day for men. One drink equals 12 oz of beer, 5 oz of wine, or 1 oz of hard liquor.  Keep  all follow-up visits as told by your health care provider. This is important. Contact a health care provider if:  You have not had a bone density screening for osteoporosis and you are: ? A woman, age 17 or older. ? A man, age 52 or older.  You are a postmenopausal woman who has not had a bone density screening for osteoporosis.  You are older than age 58 and you want to know if you should have bone density screening for osteoporosis. Summary  Osteopenia is a loss of thickness (density) inside of the bones. Another name for osteopenia is low bone mass.  Osteopenia is not a disease, but it may increase your risk for a condition that causes the bones to become thin and break more easily (osteoporosis).  You may be at risk for osteopenia if you are older than age 29 or if you are a woman who went through early menopause.  Osteopenia does not cause any symptoms, but it can be diagnosed with a bone density screening test.  Dietary and lifestyle changes are the first treatment for osteopenia. These may lower your risk for osteoporosis. This information is not intended to replace advice given to you by your health care provider. Make sure you discuss any questions you have with your health care provider. Document Revised: 06/20/2017 Document Reviewed: 04/16/2017 Elsevier Patient Education  2020 [employer].

## 2019-08-25 NOTE — Telephone Encounter (Signed)
I can't find any specific guidance for telehealth for the state plan, patient could call insurance and find out if cpe is covered under plan. They may follow BCBS of [place] guidance, but not sure... if patient does not want to call, then would have her come for face to face. Sorry not definitive. Thanks, Dawn   Inform pt above from billing  If needed she will need in person visit after labs for CPE due to insurance   Thanks TMS

## 2019-08-25 NOTE — Telephone Encounter (Signed)
-----   Message from Meda Klinefelter sent at 08/25/2019 10:21 AM EST ----- I can't find any specific guidance for telehealth for the state plan, patient could call insurance and find out if cpe is covered under plan.  They may follow BCBS of  guidance, but not sure...  if patient does not want to call, then would have her come for face to face.  Sorry not definitive. Thanks, Dawn ----- Message ----- From: McLean-Scocuzza, Pasty Spillers, MD Sent: 08/25/2019   8:56 AM EST To: Meda Klinefelter  Pt stating has state health plan and Rosann Auerbach Does state health plan cover telephone cpe's? I was having IT problems this am and late start I advised pt if they do not will not charge and have her come in for in person physical   Thanks tMS

## 2019-08-25 NOTE — Progress Notes (Signed)
Telephone Note  I connected with Chelsea Wagner  on 08/25/19 at  8:22 AM EST by a telephone and verified that I am speaking with the correct person using two identifiers.  Location patient: home Location provider:work or home office Persons participating in the virtual visit: patient, provider  I discussed the limitations of evaluation and management by telemedicine and the availability of in person appointments. The patient expressed understanding and agreed to proceed.   HPI: This was going to be annual wellness but insurance does not cover physicals virtually has Chelsea Wagner will check on state health plan to see if they do   Due to tech issues and if physical not covered via insurance will do no charge today and get pt in person visit for physical but do labs before next visit  Large breasts x years and top heavy wants to discuss options has to lift breasts up and low back stretches/pulls and she is c/w this and h/o osteopenia in spine per pt last DEXA T score -2.4 but the last report I have lumbar T score -2.2 will [facility] from [facility]. She wears supportive bras. Denies neck/shoulter pain. 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 General: wt stable  HEENT: nl hearing  CV: no chest pain  Lungs: no sob  GI: no GIB GU: +premature ovarian failure  MSK: +low back pulling  Neuro: no h/a  Skin: no issues  Psych: normal mood   Past Medical History:  Diagnosis Date  . Back pain    low back pain   . Fibrocystic breast   . History of fainting spells of unknown cause   . Infertility associated with anovulation   . Osteopenia   . 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 Premature ovarian failure   . Premature ovarian failure     Past Surgical History:  Procedure Laterality Date  . BREAST EXCISIONAL BIOPSY Left    age 69's  . BREAST LUMPECTOMY Left 1999  . BREAST LUMPECTOMY      benign tumor   . BREAST SURGERY      Family History  Problem Relation Age of Onset  . Diabetes Mother   . Heart disease Mother   . Hypertension Mother   . Fibromyalgia Mother   . Other Mother        cardiac arrythmia   . Mental illness Father        bipolar   . Hypertension Father   . Hyperlipidemia Father   . Diabetes Father   . Kidney disease Father   . Bradycardia Father   . Dementia Father        lewy body dementia 55 died   . Breast cancer Paternal Aunt 77  . 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  calcium citrate-vitamin D (CITRACAL+D) 315-200 MG-UNIT tablet, Take 1 tablet by mouth 2 (two) times daily., Disp: , Rfl:  .  Multiple Vitamins-Minerals (ALIVE ONCE DAILY WOMENS PO), Take by mouth., Disp: , Rfl:   EXAM:  VITALS  per patient if applicable:  GENERAL: alert, oriented, appears well and in no acute distress  PSYCH/NEURO: pleasant and cooperative, no obvious depression or anxiety, speech and thought processing grossly intact  ASSESSMENT AND PLAN:  Discussed the following assessment and plan:  Large breasts Consider referral plastics pt to do research and let me know who she wants to go to   HM Declines flu shot  rec MMR vx  Hep B immune  rec healthy diet and exercise rec healthy diet and exercise  mammo 08/12/19 normal  Colonoscopy consider wait to 44 y.o disc new screening age 10-49 today DEXA 07/2018 T score -2.4 need to get report from [facility] rec mvt and vitamin D3 2000 extra rec +1000 in Citracal/1200 calcium Pap [facility] 08/2018 in [place] -need ROI to see when next pap due 09/2019 2nd/3rd week march 2021     -we discussed possible serious and likely etiologies, options for  evaluation and workup, limitations of telemedicine visit vs in person visit, treatment, treatment risks and precautions. Pt prefers to treat via telemedicine empirically rather then risking or undertaking an in person visit at this moment. Patient agrees to seek prompt in person care if worsening, new symptoms arise, or if is not improving with treatment.   I discussed the assessment and treatment plan with the patient. The patient was provided an opportunity to ask questions and all were answered. The patient agreed with the plan and demonstrated an understanding of the instructions.   The patient was advised to call back or seek an in-person evaluation if the symptoms worsen or if the condition fails to improve as anticipated.  Time spent 21 min Chelsea Jackson, MD

## 2019-08-26 NOTE — Telephone Encounter (Signed)
I called patient & left detailed message informing of below. I asked that she call us back so we can schedule her in person if she does not want to call insurance.

## 2019-09-06 ENCOUNTER — Encounter: Payer: Self-pay | Admitting: Internal Medicine

## 2019-09-06 ENCOUNTER — Telehealth: Payer: Self-pay | Admitting: Internal Medicine

## 2019-09-06 NOTE — Telephone Encounter (Signed)
Call pt  1. does her insurance cover phone physical? If not we will need an in person appt for physical?   2. Bone density received report 09/10/18 T score -2.7 =osteoporosis  Did ob/gyn review treatment options ?  If not schedule appt to discuss with me?   TMS

## 2019-09-07 NOTE — Telephone Encounter (Signed)
Left message for patient to return call back.  

## 2019-09-18 ENCOUNTER — Ambulatory Visit: Payer: BC Managed Care – PPO

## 2019-09-19 ENCOUNTER — Ambulatory Visit: Payer: BC Managed Care – PPO | Attending: Internal Medicine

## 2019-09-19 ENCOUNTER — Ambulatory Visit: Payer: BC Managed Care – PPO

## 2019-09-19 DIAGNOSIS — Z23 Encounter for immunization: Secondary | ICD-10-CM | POA: Insufficient documentation

## 2019-09-19 NOTE — Progress Notes (Signed)
   Covid-19 Vaccination Clinic  Name:  Chelsea Wagner    MRN: [id number] DOB: [birth year]  09/19/2019  [name] was observed post Covid-19 immunization for 15 minutes without incidence. She was provided with Vaccine Information Sheet and instruction to access the V-Safe system.   [name] was instructed to call 911 with any severe reactions post vaccine: Marland Kitchen Difficulty breathing  . Swelling of your face and throat  . A fast heartbeat  . A bad rash all over your body  . Dizziness and weakness    Immunizations Administered    Name Date Dose VIS Date Route   Pfizer COVID-19 Vaccine 09/19/2019  8:27 AM 0.3 mL 07/02/2019 Intramuscular   Manufacturer: [employer], [employer]   Lot: [id number]   NDC: [address]-0

## 2019-10-04 ENCOUNTER — Other Ambulatory Visit: Payer: BC Managed Care – PPO

## 2019-10-11 ENCOUNTER — Ambulatory Visit: Payer: BC Managed Care – PPO | Attending: Internal Medicine

## 2019-10-11 DIAGNOSIS — Z23 Encounter for immunization: Secondary | ICD-10-CM

## 2019-10-11 NOTE — Progress Notes (Signed)
   Covid-19 Vaccination Clinic  Name:  Kimarie Coor    MRN: [id number] DOB: 1976-02-10  10/11/2019  Ms. Oregel was observed post Covid-19 immunization for 15 minutes without incident. She was provided with Vaccine Information Sheet and instruction to access the V-Safe system.   Ms. Cleaver was instructed to call 911 with any severe reactions post vaccine: Marland Kitchen Difficulty breathing  . Swelling of face and throat  . A fast heartbeat  . A bad rash all over body  . Dizziness and weakness   Immunizations Administered    Name Date Dose VIS Date Route   Pfizer COVID-19 Vaccine 10/11/2019  8:17 AM 0.3 mL 07/02/2019 Intramuscular   Manufacturer: [employer], [employer]   Lot: [id number]   NDC: [address]-3

## 2019-10-16 ENCOUNTER — Ambulatory Visit: Payer: BC Managed Care – PPO

## 2019-10-28 ENCOUNTER — Ambulatory Visit (INDEPENDENT_AMBULATORY_CARE_PROVIDER_SITE_OTHER): Payer: BC Managed Care – PPO | Admitting: Internal Medicine

## 2019-10-28 ENCOUNTER — Encounter: Payer: Self-pay | Admitting: Internal Medicine

## 2019-10-28 ENCOUNTER — Other Ambulatory Visit: Payer: Self-pay

## 2019-10-28 VITALS — BP 122/80 | HR 53 | Temp 96.6°F | Ht 69.0 in | Wt 157.6 lb

## 2019-10-28 DIAGNOSIS — R009 Unspecified abnormalities of heart beat: Secondary | ICD-10-CM

## 2019-10-28 DIAGNOSIS — Z0000 Encounter for general adult medical examination without abnormal findings: Secondary | ICD-10-CM

## 2019-10-28 DIAGNOSIS — N3000 Acute cystitis without hematuria: Secondary | ICD-10-CM

## 2019-10-28 DIAGNOSIS — M810 Age-related osteoporosis without current pathological fracture: Secondary | ICD-10-CM | POA: Diagnosis not present

## 2019-10-28 DIAGNOSIS — I493 Ventricular premature depolarization: Secondary | ICD-10-CM

## 2019-10-28 DIAGNOSIS — R001 Bradycardia, unspecified: Secondary | ICD-10-CM | POA: Insufficient documentation

## 2019-10-28 NOTE — Patient Instructions (Addendum)
[facility]  Vitamin D3 2000 daily     Premature Ventricular Contraction  A premature ventricular contraction (PVC) is a common kind of irregular heartbeat (arrhythmia). These contractions are extra heartbeats that start in the ventricles of the heart and occur too early in the normal sequence. During the PVC, the heart's normal electrical pathway is not used, so the beat is shorter and less effective. In most cases, these contractions come and go and do not require treatment. What are the causes? Common causes of the condition include:  Smoking.  Drinking alcohol.  Certain medicines.  Some illegal drugs.  Stress.  Caffeine. Certain medical conditions can also cause PVCs:  Heart failure.  Heart attack, or coronary artery disease.  Heart valve problems.  Changes in minerals in the blood (electrolytes).  Low blood oxygen levels or high carbon dioxide levels. In many cases, the cause of this condition is not known. What are the signs or symptoms? The main symptom of this condition is fast or skipped heartbeats (palpitations). Other symptoms include:  Chest pain.  Shortness of breath.  Feeling tired.  Dizziness.  Difficulty exercising. In some cases, there are no symptoms. How is this diagnosed? This condition may be diagnosed based on:  Your medical history.  A physical exam. During the exam, the health care provider will check for irregular heartbeats.  Tests, such as: ? An ECG (electrocardiogram) to monitor the electrical activity of your heart. ? An ambulatory cardiac monitor. This device records your heartbeats for 24 hours or more. ? Stress tests to see how exercise affects your heart rhythm and blood supply. ? An echocardiogram. This test uses sound waves (ultrasound) to produce an image of your heart. ? An electrophysiology study (EPS). This test checks for electrical problems in your heart. How is this treated? Treatment for this condition  depends on any underlying conditions, the type of PVCs that you are having, and how much the symptoms are interfering with your daily life. Possible treatments include:  Avoiding things that cause premature contractions (triggers). These include caffeine and alcohol.  Taking medicines if symptoms are severe or if the extra heartbeats are frequent.  Getting treatment for underlying conditions that cause PVCs.  Having an implantable cardioverter defibrillator (ICD), if you are at risk for a serious arrhythmia. The ICD is a small device that is inserted into your chest to monitor your heartbeat. When it senses an irregular heartbeat, it sends a shock to bring the heartbeat back to normal.  Having a procedure to destroy the portion of the heart tissue that sends out abnormal signals (catheter ablation). In some cases, no treatment is required. Follow these instructions at home: Lifestyle  Do not use any products that contain nicotine or tobacco, such as cigarettes, e-cigarettes, and chewing tobacco. If you need help quitting, ask your health care provider.  Do not use illegal drugs.  Exercise regularly. Ask your health care provider what type of exercise is safe for you.  Try to get at least 7-9 hours of sleep each night, or as much as recommended by your health care provider.  Find healthy ways to manage stress. Avoid stressful situations when possible. Alcohol use  Do not drink alcohol if: ? Your health care provider tells you not to drink. ? You are pregnant, may be pregnant, or are planning to become pregnant. ? Alcohol triggers your episodes.  If you drink alcohol: ? Limit how much you use to:  0-1 drink a day for women.  0-2  drinks a day for men.  Be aware of how much alcohol is in your drink. In the [place] one drink equals one 12 oz bottle of beer (355 mL), one 5 oz glass of wine (148 mL), or one 1 oz glass of hard liquor (44 mL). General instructions  Take  over-the-counter and prescription medicines only as told by your health care provider.  If caffeine triggers episodes of PVC, do not eat, drink, or use anything with caffeine in it.  Keep all follow-up visits as told by your health care provider. This is important. Contact a health care provider if you:  Feel palpitations. Get help right away if you:  Have chest pain.  Have shortness of breath.  Have sweating for no reason.  Have nausea and vomiting.  Become light-headed or you faint. Summary  A premature ventricular contraction (PVC) is a common kind of irregular heartbeat (arrhythmia).  In most cases, these contractions come and go and do not require treatment.  You may need to wear an ambulatory cardiac monitor. This records your heartbeats for 24 hours or more.  Treatment depends on any underlying conditions, the type of PVCs that you are having, and how much the symptoms are interfering with your daily life. This information is not intended to replace advice given to you by your health care provider. Make sure you discuss any questions you have with your health care provider. Document Revised: 04/02/2018 Document Reviewed: 04/02/2018 Elsevier Patient Education  2020 [place].   Interstitial Cystitis  Interstitial cystitis is inflammation of the bladder. This may cause pain in the bladder area as well as a frequent and urgent need to urinate. The bladder is a hollow organ in the lower part of the abdomen. It stores urine after the urine is made in the kidneys. The severity of interstitial cystitis can vary from person to person. You may have flare-ups, and then your symptoms may go away for a while. For many people, it becomes a long-term (chronic) problem. What are the causes? The cause of this condition is not known. What increases the risk? The following factors may make you more likely to develop this condition:  You are female.  You have fibromyalgia.  You  have irritable bowel syndrome (IBS).  You have endometriosis. This condition may be aggravated by:  Stress.  Smoking.  Spicy foods. What are the signs or symptoms? Symptoms of interstitial cystitis vary, and they can change over time. Symptoms may include:  Discomfort or pain in the bladder area, which is in the lower abdomen. Pain can range from mild to severe. The pain may change in intensity as the bladder fills with urine or as it empties.  Pain in the pelvic area, between the hip bones.  An urgent need to urinate.  Frequent urination.  Pain during urination.  Pain during sex.  Blood in the urine. For women, symptoms often get worse during menstruation. How is this diagnosed? This condition is diagnosed based on your symptoms, your medical history, and a physical exam. You may have tests to rule out other conditions, such as:  Urine tests.  Cystoscopy. For this test, a tool similar to a very thin telescope is used to look into your bladder.  Biopsy. This involves taking a sample of tissue from the bladder to be examined under a microscope. How is this treated? There is no cure for this condition, but treatment can help you control your symptoms. Work closely with your health care provider to  find the most effective treatments for you. Treatment options may include:  Medicines to relieve pain and reduce how often you feel the need to urinate.  Learning ways to control when you urinate (bladder training).  Lifestyle changes, such as changing your diet or taking steps to control stress.  Using a device that provides electrical stimulation to your nerves, which can relieve pain (neuromodulation therapy). The device is placed on your back, where it blocks the nerves that cause you to feel pain in your bladder area.  A procedure that stretches your bladder by filling it with air or fluid.  Surgery. This is rare. It is only done for extreme cases, if other treatments do  not help. Follow these instructions at home: Bladder training   Use bladder training techniques as directed. Techniques may include: ? Urinating at scheduled times. ? Training yourself to delay urination. ? Doing exercises (Kegel exercises) to strengthen the muscles that control urine flow.  Keep a bladder diary. ? Write down the times that you urinate and any symptoms that you have. This can help you find out which foods, liquids, or activities make your symptoms worse. ? Use your bladder diary to schedule bathroom trips. If you are away from home, plan to be near a bathroom at each of your scheduled times.  Make sure that you urinate just before you leave the house and just before you go to bed. Eating and drinking  Make dietary changes as recommended by your health care provider. You may need to avoid: ? Spicy foods. ? Foods that contain a lot of potassium.  Limit your intake of beverages that make you need to urinate. These include: ? Caffeinated beverages like soda, coffee, and tea. ? Alcohol. General instructions  Take over-the-counter and prescription medicines only as told by your health care provider.  Do not drink alcohol.  You can try a warm or cool compress over your bladder for comfort.  Avoid wearing tight clothing.  Do not use any products that contain nicotine or tobacco, such as cigarettes and e-cigarettes. If you need help quitting, ask your health care provider.  Keep all follow-up visits as told by your health care provider. This is important. Contact a health care provider if you have:  Symptoms that do not get better with treatment.  Pain or discomfort that gets worse.  More frequent urges to urinate.  A fever. Get help right away if:  You have no control over when you urinate. Summary  Interstitial cystitis is inflammation of the bladder.  This condition may cause pain in the bladder area as well as a frequent and urgent need to  urinate.  You may have flare-ups of the condition, and then it may go away for a while. For many people, it becomes a long-term (chronic) problem.  There is no cure for interstitial cystitis, but treatment methods are available to control your symptoms. This information is not intended to replace advice given to you by your health care provider. Make sure you discuss any questions you have with your health care provider. Document Revised: 06/20/2017 Document Reviewed: 06/02/2017 Elsevier Patient Education  [employer].   Urinary Tract Infection, Adult  A urinary tract infection (UTI) is an infection of any part of the urinary tract. The urinary tract includes the kidneys, ureters, bladder, and urethra. These organs make, store, and get rid of urine in the body. Your health care provider may use other names to describe the infection. An upper UTI  affects the ureters and kidneys (pyelonephritis). A lower UTI affects the bladder (cystitis) and urethra (urethritis). What are the causes? Most urinary tract infections are caused by bacteria in your genital area, around the entrance to your urinary tract (urethra). These bacteria grow and cause inflammation of your urinary tract. What increases the risk? You are more likely to develop this condition if:  You have a urinary catheter that stays in place (indwelling).  You are not able to control when you urinate or have a bowel movement (you have incontinence).  You are female and you: ? Use a spermicide or diaphragm for birth control. ? Have low estrogen levels. ? Are pregnant.  You have certain genes that increase your risk (genetics).  You are sexually active.  You take antibiotic medicines.  You have a condition that causes your flow of urine to slow down, such as: ? An enlarged prostate, if you are female. ? Blockage in your urethra (stricture). ? A kidney stone. ? A nerve condition that affects your bladder control (neurogenic  bladder). ? Not getting enough to drink, or not urinating often.  You have certain medical conditions, such as: ? Diabetes. ? A weak disease-fighting system (immunesystem). ? Sickle cell disease. ? Gout. ? Spinal cord injury. What are the signs or symptoms? Symptoms of this condition include:  Needing to urinate right away (urgently).  Frequent urination or passing small amounts of urine frequently.  Pain or burning with urination.  Blood in the urine.  Urine that smells bad or unusual.  Trouble urinating.  Cloudy urine.  Vaginal discharge, if you are female.  Pain in the abdomen or the lower back. You may also have:  Vomiting or a decreased appetite.  Confusion.  Irritability or tiredness.  A fever.  Diarrhea. The first symptom in older adults may be confusion. In some cases, they may not have any symptoms until the infection has worsened. How is this diagnosed? This condition is diagnosed based on your medical history and a physical exam. You may also have other tests, including:  Urine tests.  Blood tests.  Tests for sexually transmitted infections (STIs). If you have had more than one UTI, a cystoscopy or imaging studies may be done to determine the cause of the infections. How is this treated? Treatment for this condition includes:  Antibiotic medicine.  Over-the-counter medicines to treat discomfort.  Drinking enough water to stay hydrated. If you have frequent infections or have other conditions such as a kidney stone, you may need to see a health care provider who specializes in the urinary tract (urologist). In rare cases, urinary tract infections can cause sepsis. Sepsis is a life-threatening condition that occurs when the body responds to an infection. Sepsis is treated in the hospital with IV antibiotics, fluids, and other medicines. Follow these instructions at home:  Medicines  Take over-the-counter and prescription medicines only as told  by your health care provider.  If you were prescribed an antibiotic medicine, take it as told by your health care provider. Do not stop using the antibiotic even if you start to feel better. General instructions  Make sure you: ? Empty your bladder often and completely. Do not hold urine for long periods of time. ? Empty your bladder after sex. ? Wipe from front to back after a bowel movement if you are female. Use each tissue one time when you wipe.  Drink enough fluid to keep your urine pale yellow.  Keep all follow-up visits as told by  your health care provider. This is important. Contact a health care provider if:  Your symptoms do not get better after 1-2 days.  Your symptoms go away and then return. Get help right away if you have:  Severe pain in your back or your lower abdomen.  A fever.  Nausea or vomiting. Summary  A urinary tract infection (UTI) is an infection of any part of the urinary tract, which includes the kidneys, ureters, bladder, and urethra.  Most urinary tract infections are caused by bacteria in your genital area, around the entrance to your urinary tract (urethra).  Treatment for this condition often includes antibiotic medicines.  If you were prescribed an antibiotic medicine, take it as told by your health care provider. Do not stop using the antibiotic even if you start to feel better.  Keep all follow-up visits as told by your health care provider. This is important. This information is not intended to replace advice given to you by your health care provider. Make sure you discuss any questions you have with your health care provider. Document Revised: 06/25/2018 Document Reviewed: 01/15/2018 Elsevier Patient Education  [employer].    Osteoporosis  Osteoporosis is thinning and loss of density in your bones. Osteoporosis makes bones more brittle and fragile and more likely to break (fracture). Over time, osteoporosis can cause your bones  to become so weak that they fracture after a minor fall. Bones in the hip, wrist, and spine are most likely to fracture due to osteoporosis. What are the causes? The exact cause of this condition is not known. What increases the risk? You may be at greater risk for osteoporosis if you:  Have a family history of the condition.  Have poor nutrition.  Use steroid medicines, such as prednisone.  Are female.  Are age 62 or older.  Smoke or have a history of smoking.  Are not physically active (are sedentary).  Are white (Caucasian) or of Asian descent.  Have a small body frame.  Take certain medicines, such as antiseizure medicines. What are the signs or symptoms? A fracture might be the first sign of osteoporosis, especially if the fracture results from a fall or injury that usually would not cause a bone to break. Other signs and symptoms include:  Pain in the neck or low back.  Stooped posture.  Loss of height. How is this diagnosed? This condition may be diagnosed based on:  Your medical history.  A physical exam.  A bone mineral density test, also called a DXA or DEXA test (dual-energy X-ray absorptiometry test). This test uses X-rays to measure the amount of minerals in your bones. How is this treated? The goal of treatment is to strengthen your bones and lower your risk for a fracture. Treatment may involve:  Making lifestyle changes, such as: ? Including foods with more calcium and vitamin D in your diet. ? Doing weight-bearing and muscle-strengthening exercises. ? Stopping tobacco use. ? Limiting alcohol intake.  Taking medicine to slow the process of bone loss or to increase bone density.  Taking daily supplements of calcium and vitamin D.  Taking hormone replacement medicines, such as estrogen for women and testosterone for men.  Monitoring your levels of calcium and vitamin D. Follow these instructions at home:  Activity  Exercise as told by your  health care provider. Ask your health care provider what exercises and activities are safe for you. You should do: ? Exercises that make you work against gravity (weight-bearing exercises), such  as tai chi, yoga, or walking. ? Exercises to strengthen muscles, such as lifting weights. Lifestyle  Limit alcohol intake to no more than 1 drink a day for nonpregnant women and 2 drinks a day for men. One drink equals 12 oz of beer, 5 oz of wine, or 1 oz of hard liquor.  Do not use any products that contain nicotine or tobacco, such as cigarettes and e-cigarettes. If you need help quitting, ask your health care provider. Preventing falls  Use devices to help you move around (mobility aids) as needed, such as canes, walkers, scooters, or crutches.  Keep rooms well-lit and clutter-free.  Remove tripping hazards from walkways, including cords and throw rugs.  Install grab bars in bathrooms and safety rails on stairs.  Use rubber mats in the bathroom and other areas that are often wet or slippery.  Wear closed-toe shoes that fit well and support your feet. Wear shoes that have rubber soles or low heels.  Review your medicines with your health care provider. Some medicines can cause dizziness or changes in blood pressure, which can increase your risk of falling. General instructions  Include calcium and vitamin D in your diet. Calcium is important for bone health, and vitamin D helps your body to absorb calcium. Good sources of calcium and vitamin D include: ? Certain fatty fish, such as salmon and tuna. ? Products that have calcium and vitamin D added to them (fortified products), such as fortified cereals. ? Egg yolks. ? Cheese. ? Liver.  Take over-the-counter and prescription medicines only as told by your health care provider.  Keep all follow-up visits as told by your health care provider. This is important. Contact a health care provider if:  You have never been screened for  osteoporosis and you are: ? A woman who is age 29 or older. ? A man who is age 44 or older. Get help right away if:  You fall or injure yourself. Summary  Osteoporosis is thinning and loss of density in your bones. This makes bones more brittle and fragile and more likely to break (fracture),even with minor falls.  The goal of treatment is to strengthen your bones and reduce your risk for a fracture.  Include calcium and vitamin D in your diet. Calcium is important for bone health, and vitamin D helps your body to absorb calcium.  Talk with your health care provider about screening for osteoporosis if you are a woman who is age 45 or older, or a man who is age 36 or older. This information is not intended to replace advice given to you by your health care provider. Make sure you discuss any questions you have with your health care provider. Document Revised: 06/20/2017 Document Reviewed: 05/02/2017 Elsevier Patient Education  2020 [employer].

## 2019-10-28 NOTE — Progress Notes (Signed)
Chief Complaint  Patient presents with  . Annual Exam   annual 1. Recurrent UTI with increased freq and urination at night and at times lower ab pain saw Dr. Linda Hedges with UTIs in fall 2020 and 09/2019 and given Abx ? Name  2. Irregular heart beat mom and m uncle h/o irregular HB 3. Osteoporosis noted dexa 2020 declines meds for now and endocrine referral she is doing exercises dance and taking supplements including strontium    Review of Systems  Constitutional: Negative for weight loss.  HENT: Negative for hearing loss.   Eyes: Negative for blurred vision.  Respiratory: Negative for shortness of breath.   Cardiovascular: Negative for chest pain.       +irreg. HB  Gastrointestinal: Negative for abdominal pain.  Genitourinary: Positive for frequency.  Musculoskeletal: Negative for falls.  Skin: Negative for rash.  Neurological: Negative for headaches.  Psychiatric/Behavioral: Negative for depression.   Past Medical History:  Diagnosis Date  . Back pain    low back pain   . Fibrocystic breast   . History of fainting spells of unknown cause   . Infertility associated with anovulation   . Osteopenia   . 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 Premature ovarian failure   . Premature ovarian failure    Past Surgical History:  Procedure Laterality Date  . BREAST EXCISIONAL BIOPSY Left    age 80's  . BREAST LUMPECTOMY Left 1999  . BREAST LUMPECTOMY     benign tumor   . BREAST SURGERY     Family History  Problem Relation Age of Onset  . Diabetes Mother   . Heart disease Mother   . Hypertension Mother   . Fibromyalgia Mother   . Other Mother        cardiac arrythmia   . Mental illness Father        bipolar   . Hypertension Father   . Hyperlipidemia Father   . Diabetes Father   . Kidney disease Father   . Bradycardia Father   . Dementia Father        lewy body dementia 43 died   . Breast cancer Paternal Aunt 41  . Cancer Paternal Aunt        breast dx'ed in 42s   .  Other Maternal Uncle        cardiac arrythmia   Social History   Socioeconomic History  . Marital status: Married    Spouse name: Not on file  . Number of children: Not on file  . Years of education: Not on file  . Highest education level: Not on file  Occupational History  . Not on file  Tobacco Use  . Smoking status: Never Smoker  . Smokeless tobacco: Never Used  Substance and Sexual Activity  . Alcohol use: Yes    Alcohol/week: 1.0 standard drinks    Types: 1 Standard drinks or equivalent per week  . Drug use: No  . Sexual activity: Yes    Partners: Male  Other Topics Concern  . 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 Difficulty of Paying Living Expenses:   Food  Insecurity:   . Worried About Charity fundraiser in the Last Year:   . Arboriculturist in the Last Year:   Transportation Needs:   . Film/video editor (Medical):   Marland Kitchen Lack of Transportation (Non-Medical):   Physical Activity:   . Days of Exercise per Week:   . Minutes of Exercise per Session:   Stress:   . Feeling of Stress :   Social Connections:   . Frequency of Communication with Friends and Family:   . Frequency of Social Gatherings with Friends and Family:   . Attends Religious Services:   . Active Member of Clubs or Organizations:   . Attends Archivist Meetings:   Marland Kitchen Marital Status:   Intimate Partner Violence:   . Fear of Current or Ex-Partner:   . Emotionally Abused:   Marland Kitchen Physically Abused:   . Sexually Abused:    Current Meds  Medication Sig  . calcium citrate-vitamin D (CITRACAL+D) 315-200 MG-UNIT tablet Take 1 tablet by mouth 2 (two) times daily.  Marland Kitchen ESOMEPRAZOLE  STRONTIUM PO Take by mouth.  . Multiple Vitamin (MULTIVITAMIN ADULT PO) Take by mouth.  . Multiple Vitamins-Minerals (ALIVE ONCE DAILY WOMENS PO) Take by mouth.   Allergies  Allergen Reactions  . Sulfa Antibiotics Hives  . Sulfur    Recent Results (from the past 2160 hour(s))  Novel Coronavirus, NAA ([facility])     Status: None   Collection Time: 08/23/19 12:28 PM   Specimen: Nasopharyngeal(NP) swabs in vial transport medium   NASOPHARYNGE  TESTING  Result Value Ref Range   SARS-CoV-2, NAA Not Detected Not Detected    Comment: This nucleic acid amplification test was developed and its performance characteristics determined by [employer]. Nucleic acid amplification tests include RT-PCR and TMA. This test has not been FDA cleared or approved. This test has been authorized by FDA under an Emergency Use Authorization (EUA). This test is only authorized for the duration of time the declaration that circumstances exist justifying the authorization of the emergency use of in vitro diagnostic tests for detection of SARS-CoV-2 virus and/or diagnosis of COVID-19 infection under section 564(b)(1) of the Act, 21 U.S.C. 789FYB-0(F) (1), unless the authorization is terminated or revoked sooner. When diagnostic testing is negative, the possibility of a false negative result should be considered in the context of a patient's recent exposures and the presence of clinical signs and symptoms consistent with COVID-19. An individual without symptoms of COVID-19 and who is not shedding SARS-CoV-2 virus wo uld expect to have a negative (not detected) result in this assay.   Urinalysis, Routine w reflex microscopic     Status: None   Collection Time: 10/28/19  3:37 PM  Result Value Ref Range   Color, Urine YELLOW YELLOW   APPearance CLEAR CLEAR   Specific Gravity, Urine 1.006 1.001 - 1.03   pH 6.0 5.0 - 8.0   Glucose, UA NEGATIVE NEGATIVE   Bilirubin Urine NEGATIVE NEGATIVE   Ketones, ur  NEGATIVE NEGATIVE   Hgb urine dipstick NEGATIVE NEGATIVE   Protein, ur NEGATIVE NEGATIVE   Nitrite NEGATIVE NEGATIVE   Leukocytes,Ua NEGATIVE NEGATIVE   Objective  Body mass index is 23.27 kg/m. Wt Readings from Last 3 Encounters:  10/28/19 157 lb 9.6 oz (71.5 kg)  08/25/19 157 lb (71.2 kg)  04/23/19 157 lb 9.6 oz (71.5 kg)   Temp Readings from Last 3 Encounters:  10/28/19 (!) 96.6 F (35.9 C) (Temporal)  01/06/19 (!) 97.1 F (36.2 C)  08/20/18 98.4 F (36.9 C) (Oral)   BP Readings from Last 3 Encounters:  10/28/19 122/80  09/21/18 120/73  08/20/18 122/76   Pulse Readings from Last 3 Encounters:  10/28/19 (!) 53  09/21/18 70  08/20/18 63    Physical Exam Vitals and nursing note reviewed.  Constitutional:      Appearance: Normal appearance. She is well-developed and well-groomed.  HENT:     Head: Normocephalic and atraumatic.     Mouth/Throat:     Mouth: Mucous membranes are moist.     Pharynx: Oropharynx is clear.  Eyes:     Conjunctiva/sclera: Conjunctivae normal.     Pupils: Pupils are equal, round, and reactive to light.  Cardiovascular:     Rate and Rhythm: Regular rhythm. Bradycardia present.     Heart sounds: Normal heart sounds. No murmur.     Comments: PVCs on exam Pulmonary:     Effort: Pulmonary effort is normal.     Breath sounds: Normal breath sounds.  Skin:    General: Skin is warm and dry.  Neurological:     General: No focal deficit present.     Mental Status: She is alert. Mental status is at baseline.     Gait: Gait normal.  Psychiatric:        Attention and Perception: Attention and perception normal.        Mood and Affect: Mood and affect normal.        Speech: Speech normal.        Behavior: Behavior normal. Behavior is cooperative.        Thought Content: Thought content normal.        Cognition and Memory: Cognition and memory normal.        Judgment: Judgment normal.     Assessment  Plan  Annual physical exam Declines flu  shot  rec MMR vx  Hep B immune  covid shot 2/2  rec healthy diet and exercise rec healthy diet and exercise  mammo 08/12/19 normal [facility] Colonoscopy consider wait to 44 y.o disc new screening age 67-49 today DEXA 07/2018 T score -2.4 need to get report from [facility] rec mvt and vitamin D3 2000 extra rec +1000 in Citracal/1200 calcium Pap [facility] 08/2018 in [place] -need ROI to see when next pap due 09/2019 2nd/3rd week march 2021  Acute cystitis without hematuria - Plan: Urinalysis, Routine w reflex microscopic, Urine Culture, Ambulatory referral to Urology  Osteoporosis, unspecified osteoporosis type, unspecified pathological fracture presence Hold meds and endocrine for now per pt  Cont exercise and supplements   Heart beat abnormality - Plan: EKG 12-Lead, Ambulatory referral to Cardiology PVC's (premature ventricular contractions) - Plan: Ambulatory referral to Cardiology Sinus bradycardia - Plan: Ambulatory referral to Cardiology  EKG with SB and PVCs today     Provider: Dr. Olivia Mackie McLean-Scocuzza-[facility]

## 2019-10-29 LAB — URINALYSIS, ROUTINE W REFLEX MICROSCOPIC
Bilirubin Urine: NEGATIVE
Glucose, UA: NEGATIVE
Hgb urine dipstick: NEGATIVE
Ketones, ur: NEGATIVE
Leukocytes,Ua: NEGATIVE
Nitrite: NEGATIVE
Protein, ur: NEGATIVE
Specific Gravity, Urine: 1.006 (ref 1.001–1.03)
pH: 6 (ref 5.0–8.0)

## 2019-10-29 LAB — URINE CULTURE
MICRO NUMBER:: 10341909
SPECIMEN QUALITY:: ADEQUATE

## 2019-10-29 NOTE — Addendum Note (Signed)
Addended by: Quentin Ore on: 10/29/2019 12:41 PM   Modules accepted: Orders

## 2019-11-02 ENCOUNTER — Telehealth: Payer: Self-pay | Admitting: Internal Medicine

## 2019-11-02 NOTE — Telephone Encounter (Signed)
Called [facility] states they need a signed release from the patient.   Left message to return call and come in to sign form.

## 2019-11-02 NOTE — Telephone Encounter (Signed)
-----   Message from Bevelyn Buckles, MD sent at 10/29/2019 12:32 PM EDT ----- Need records [facility] Last pap Also UTI culture report and Abx pt on in fall 2020 and 09/2019 Dr. Gillian Scarce [facility]

## 2019-11-05 ENCOUNTER — Encounter: Payer: Self-pay | Admitting: Cardiology

## 2019-11-05 ENCOUNTER — Ambulatory Visit (INDEPENDENT_AMBULATORY_CARE_PROVIDER_SITE_OTHER): Payer: BC Managed Care – PPO | Admitting: Cardiology

## 2019-11-05 ENCOUNTER — Other Ambulatory Visit: Payer: Self-pay

## 2019-11-05 ENCOUNTER — Ambulatory Visit (INDEPENDENT_AMBULATORY_CARE_PROVIDER_SITE_OTHER): Payer: BC Managed Care – PPO

## 2019-11-05 VITALS — BP 110/72 | HR 57 | Ht 69.0 in | Wt 157.1 lb

## 2019-11-05 DIAGNOSIS — I499 Cardiac arrhythmia, unspecified: Secondary | ICD-10-CM

## 2019-11-05 NOTE — Patient Instructions (Signed)
Medication Instructions:  - Your physician recommends that you continue on your current medications as directed. Please refer to the Current Medication list given to you today.  *If you need a refill on your cardiac medications before your next appointment, please call your pharmacy*   Lab Work: - none ordered  If you have labs (blood work) drawn today and your tests are completely normal, you will receive your results only by: Marland Kitchen MyChart Message (if you have MyChart) OR . A paper copy in the mail If you have any lab test that is abnormal or we need to change your treatment, we will call you to review the results.   Testing/Procedures: - Your physician has recommended that you wear a 7 day Zio (heart) monitor- placed in office today. This monitor is a medical device that records the heart's electrical activity. Doctors most often use these monitors to diagnose arrhythmias. Arrhythmias are problems with the speed or rhythm of the heartbeat. The monitor is a small device applied to your chest. You can wear one while you do your normal daily activities. While wearing this monitor if you have any symptoms to push the button and record what you felt. Once you have worn this monitor for the period of time provider prescribed (Usually 14 days), you will return the monitor device in the postage paid box. Once it is returned they will download the data collected and provide [place] with a report which the provider will then review and we will call you with those results. Important tips:  1. Avoid showering during the first 24 hours of wearing the monitor. 2. Avoid excessive sweating to help maximize wear time. 3. Do not submerge the device, no hot tubs, and no swimming pools. 4. Keep any lotions or oils away from the patch. 5. After 24 hours you may shower with the patch on. Take brief showers with your back facing the shower head.  6. Do not remove patch once it has been placed because that will interrupt  data and decrease adhesive wear time. 7. Push the button when you have any symptoms and write down what you were feeling. 8. Once you have completed wearing your monitor, remove and place into box which has postage paid and place in your outgoing mailbox.  9. If for some reason you have misplaced your box then call our office and we can provide another box and/or mail it off for you.         Follow-Up: At [facility], you and your health needs are our priority.  As part of our continuing mission to provide you with exceptional heart care, we have created designated Provider Care Teams.  These Care Teams include your primary Cardiologist (physician) and Advanced Practice Providers (APPs -  Physician Assistants and Nurse Practitioners) who all work together to provide you with the care you need, when you need it.  We recommend signing up for the patient portal called "MyChart".  Sign up information is provided on this After Visit Summary.  MyChart is used to connect with patients for Virtual Visits (Telemedicine).  Patients are able to view lab/test results, encounter notes, upcoming appointments, etc.  Non-urgent messages can be sent to your provider as well.   To learn more about what you can do with MyChart, go to [email or web address].    Your next appointment:   1 month(s)  The format for your next appointment:   In Person  Provider:   Debbe Odea, MD   Other  Instructions N/a

## 2019-11-05 NOTE — Progress Notes (Signed)
Cardiology Office Note:    Date:  11/05/2019   ID:  [facility], DOB 21-Feb-1976, MRN [id number]  PCP:  McLean-Scocuzza, Pasty Spillers, MD  Cardiologist:  Debbe Odea, MD  Electrophysiologist:  None   Referring MD: McLean-Scocuzza, French Ana *   Chief Complaint  Patient presents with  . other    PVC's. Meds reviewed verbally with pt.   C[facility] is a 44 y.o. female who is being seen today for the evaluation of abnormal heartbeat at the request of McLean-Scocuzza, French Ana *.   History of Present Illness:    C[facility] is a 44 y.o. female with no significant past medical history who presents due to irregular heartbeat.  Patient has noticed irregular heartbeats for some time now.  She denies any history of heart disease.  Denies palpitations, shortness of breath, chest pain at rest or with exertion.  Denies dizziness, orthopnea, syncope.  Her husband had told her that her heartbeat has been irregular with occasional skipped beats.  She denies drinking coffee or energy drinks.  Only drinks water or coconut water.  She was seen at her primary care provider for well care visit and EKG at the primary care provider's office on 10/28/2019 showed sinus bradycardia with PVCs.  She otherwise feels well, has no concerns at this time.  Past Medical History:  Diagnosis Date  . Back pain    low back pain   . Fibrocystic breast   . History of fainting spells of unknown cause   . Infertility associated with anovulation   . Osteopenia   . 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 Premature ovarian failure   . Premature ovarian failure     Past Surgical History:  Procedure Laterality Date  . BREAST EXCISIONAL BIOPSY Left    age 21's  . BREAST LUMPECTOMY Left 1999  . BREAST LUMPECTOMY     benign tumor   . BREAST SURGERY      Current Medications: Current Meds  Medication Sig  . calcium citrate-vitamin D (CITRACAL+D) 315-200 MG-UNIT tablet Take 1 tablet by mouth 2 (two) times daily.  Marland Kitchen  ESOMEPRAZOLE STRONTIUM PO Take by mouth.  . Multiple Vitamin (MULTIVITAMIN ADULT PO) Take by mouth.  . Multiple Vitamins-Minerals (ALIVE ONCE DAILY WOMENS PO) Take by mouth.     Allergies:   Sulfa antibiotics and Sulfur   Social History   Socioeconomic History  . Marital status: Married    Spouse name: Not on file  . Number of children: Not on file  . Years of education: Not on file  . Highest education level: Not on file  Occupational History  . Not on file  Tobacco Use  . Smoking status: Never Smoker  . Smokeless tobacco: Never Used  Substance and Sexual Activity  . Alcohol use: Yes    Alcohol/week: 1.0 standard drinks    Types: 1 Standard drinks or equivalent per week  . Drug use: No  . Sexual activity: Yes    Partners: Male  Other Topics Concern  . 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 Difficulty of Paying Living Expenses:   Food Insecurity:   .  Worried About Charity fundraiser in the Last Year:   . Arboriculturist in the Last Year:   Transportation Needs:   . Film/video editor (Medical):   Marland Kitchen Lack of Transportation (Non-Medical):   Physical Activity:   . Days of Exercise per Week:   . Minutes of Exercise per Session:   Stress:   . Feeling of Stress :   Social Connections:   . Frequency of Communication with Friends and Family:   . Frequency of Social Gatherings with Friends and Family:   . Attends Religious Services:   . Active Member of Clubs or Organizations:   . Attends Archivist Meetings:   Marland Kitchen Marital Status:      Family History: The patient's family history includes Atrial fibrillation in her mother; Bradycardia in her  father; Breast cancer (age of onset: 70) in her paternal aunt; Cancer in her paternal aunt; Dementia in her father and maternal grandmother; Diabetes in her father, maternal grandmother, and mother; Fibromyalgia in her mother; Heart Problems in her maternal grandmother; Heart disease in her mother; Hyperlipidemia in her father; Hypertension in her father, maternal grandfather, maternal grandmother, mother, paternal grandfather, and paternal grandmother; Kidney disease in her father; Mental illness in her father; Other in her maternal uncle and mother; Stroke in her paternal grandmother.  ROS:   Please see the history of present illness.     All other systems reviewed and are negative.  EKGs/Labs/Other Studies Reviewed:    The following studies were reviewed today:   EKG:  EKG is  ordered today.  The ekg ordered today demonstrates sinus bradycardia heart rate 57, occasional PVC.  Recent Labs: No results found for requested labs within last 8760 hours.  Recent Lipid Panel    Component Value Date/Time   CHOL 172 08/21/2018 0816   TRIG 30.0 08/21/2018 0816   HDL 78.20 08/21/2018 0816   CHOLHDL 2 08/21/2018 0816   VLDL 6.0 08/21/2018 0816   LDLCALC 88 08/21/2018 0816    Physical Exam:    VS:  BP 110/72 (BP Location: Right Arm, Patient Position: Sitting, Cuff Size: Normal)   Pulse (!) 57   Ht 5\' 9"  (1.753 m)   Wt 157 lb 2 oz (71.3 kg)   LMP 11/02/2011   SpO2 98%   BMI 23.20 kg/m     Wt Readings from Last 3 Encounters:  11/05/19 157 lb 2 oz (71.3 kg)  10/28/19 157 lb 9.6 oz (71.5 kg)  08/25/19 157 lb (71.2 kg)     GEN:  Well nourished, well developed in no acute distress HEENT: Normal NECK: No JVD; No carotid bruits LYMPHATICS: No lymphadenopathy CARDIAC: Occasional skipped beats, no murmurs, rubs, gallops RESPIRATORY:  Clear to auscultation without rales, wheezing or rhonchi  ABDOMEN: Soft, non-tender, non-distended MUSCULOSKELETAL:  No edema; No deformity  SKIN: Warm and  dry NEUROLOGIC:  Alert and oriented x 3 PSYCHIATRIC:  Normal affect   ASSESSMENT:    1. Irregular heart beat    PLAN:    In order of problems listed above:  1. Patient with irregular heartbeat and occasional PVCs noted on EKG.  She has no symptoms of palpitations, syncope.  Denies any cardiac risk factors.  Will evaluate patient with cardiac monitor x1 week to quantify PVC burden.  If not significant, no intervention will be needed.  Heart rate of 57 has no clinical significance.  Follow-up after cardiac monitor.  This note was generated in part or whole with voice  recognition software. Voice recognition is usually quite accurate but there are transcription errors that can and very often do occur. I apologize for any typographical errors that were not detected and corrected.  Medication Adjustments/Labs and Tests Ordered: Current medicines are reviewed at length with the patient today.  Concerns regarding medicines are outlined above.  Orders Placed This Encounter  Procedures  . LONG TERM MONITOR (3-14 DAYS)  . EKG 12-Lead   No orders of the defined types were placed in this encounter.   Patient Instructions  Medication Instructions:  - Your physician recommends that you continue on your current medications as directed. Please refer to the Current Medication list given to you today.  *If you need a refill on your cardiac medications before your next appointment, please call your pharmacy*   Lab Work: - none ordered  If you have labs (blood work) drawn today and your tests are completely normal, you will receive your results only by: Marland Kitchen MyChart Message (if you have MyChart) OR . A paper copy in the mail If you have any lab test that is abnormal or we need to change your treatment, we will call you to review the results.   Testing/Procedures: - Your physician has recommended that you wear a 7 day Zio (heart) monitor- placed in office today. This monitor is a medical device  that records the heart's electrical activity. Doctors most often use these monitors to diagnose arrhythmias. Arrhythmias are problems with the speed or rhythm of the heartbeat. The monitor is a small device applied to your chest. You can wear one while you do your normal daily activities. While wearing this monitor if you have any symptoms to push the button and record what you felt. Once you have worn this monitor for the period of time provider prescribed (Usually 14 days), you will return the monitor device in the postage paid box. Once it is returned they will download the data collected and provide [place] with a report which the provider will then review and we will call you with those results. Important tips:  1. Avoid showering during the first 24 hours of wearing the monitor. 2. Avoid excessive sweating to help maximize wear time. 3. Do not submerge the device, no hot tubs, and no swimming pools. 4. Keep any lotions or oils away from the patch. 5. After 24 hours you may shower with the patch on. Take brief showers with your back facing the shower head.  6. Do not remove patch once it has been placed because that will interrupt data and decrease adhesive wear time. 7. Push the button when you have any symptoms and write down what you were feeling. 8. Once you have completed wearing your monitor, remove and place into box which has postage paid and place in your outgoing mailbox.  9. If for some reason you have misplaced your box then call our office and we can provide another box and/or mail it off for you.         Follow-Up: At [facility], you and your health needs are our priority.  As part of our continuing mission to provide you with exceptional heart care, we have created designated Provider Care Teams.  These Care Teams include your primary Cardiologist (physician) and Advanced Practice Providers (APPs -  Physician Assistants and Nurse Practitioners) who all work together to provide  you with the care you need, when you need it.  We recommend signing up for the patient portal called "MyChart".  Sign up information is provided on this After Visit Summary.  MyChart is used to connect with patients for Virtual Visits (Telemedicine).  Patients are able to view lab/test results, encounter notes, upcoming appointments, etc.  Non-urgent messages can be sent to your provider as well.   To learn more about what you can do with MyChart, go to [email or web address].    Your next appointment:   1 month(s)  The format for your next appointment:   In Person  Provider:   Debbe Odea, MD   Other Instructions N/a      Signed, Debbe Odea, MD  11/05/2019 5:22 PM    [facility]

## 2019-11-08 NOTE — Telephone Encounter (Signed)
Patient will come by to sign a release form possibly today before we close.

## 2019-11-10 NOTE — Telephone Encounter (Signed)
Release has been signed will fill out and then fax.

## 2019-11-17 ENCOUNTER — Telehealth: Payer: Self-pay | Admitting: Internal Medicine

## 2019-11-17 NOTE — Telephone Encounter (Signed)
Signed record release sent to fax.

## 2019-11-19 ENCOUNTER — Encounter: Payer: Self-pay | Admitting: Urology

## 2019-11-19 ENCOUNTER — Other Ambulatory Visit: Payer: Self-pay

## 2019-11-19 ENCOUNTER — Other Ambulatory Visit (INDEPENDENT_AMBULATORY_CARE_PROVIDER_SITE_OTHER): Payer: BC Managed Care – PPO

## 2019-11-19 ENCOUNTER — Other Ambulatory Visit: Payer: BC Managed Care – PPO

## 2019-11-19 ENCOUNTER — Ambulatory Visit: Payer: BC Managed Care – PPO | Admitting: Urology

## 2019-11-19 VITALS — BP 106/70 | HR 71 | Ht 69.0 in | Wt 159.0 lb

## 2019-11-19 DIAGNOSIS — Z0000 Encounter for general adult medical examination without abnormal findings: Secondary | ICD-10-CM

## 2019-11-19 DIAGNOSIS — Z13220 Encounter for screening for lipoid disorders: Secondary | ICD-10-CM | POA: Diagnosis not present

## 2019-11-19 DIAGNOSIS — Z1329 Encounter for screening for other suspected endocrine disorder: Secondary | ICD-10-CM

## 2019-11-19 DIAGNOSIS — R3915 Urgency of urination: Secondary | ICD-10-CM | POA: Diagnosis not present

## 2019-11-19 DIAGNOSIS — Z113 Encounter for screening for infections with a predominantly sexual mode of transmission: Secondary | ICD-10-CM

## 2019-11-19 DIAGNOSIS — N390 Urinary tract infection, site not specified: Secondary | ICD-10-CM

## 2019-11-19 LAB — CBC WITH DIFFERENTIAL/PLATELET
Basophils Absolute: 0.1 10*3/uL (ref 0.0–0.1)
Basophils Relative: 1.4 % (ref 0.0–3.0)
Eosinophils Absolute: 0.1 10*3/uL (ref 0.0–0.7)
Eosinophils Relative: 2 % (ref 0.0–5.0)
HCT: 39.1 % (ref 36.0–46.0)
Hemoglobin: 13.3 g/dL (ref 12.0–15.0)
Lymphocytes Relative: 49.6 % — ABNORMAL HIGH (ref 12.0–46.0)
Lymphs Abs: 2 10*3/uL (ref 0.7–4.0)
MCHC: 34 g/dL (ref 30.0–36.0)
MCV: 97.8 fl (ref 78.0–100.0)
Monocytes Absolute: 0.4 10*3/uL (ref 0.1–1.0)
Monocytes Relative: 9.2 % (ref 3.0–12.0)
Neutro Abs: 1.5 10*3/uL (ref 1.4–7.7)
Neutrophils Relative %: 37.8 % — ABNORMAL LOW (ref 43.0–77.0)
Platelets: 243 10*3/uL (ref 150.0–400.0)
RBC: 4 Mil/uL (ref 3.87–5.11)
RDW: 13.6 % (ref 11.5–15.5)
WBC: 4.1 10*3/uL (ref 4.0–10.5)

## 2019-11-19 LAB — COMPREHENSIVE METABOLIC PANEL
ALT: 15 U/L (ref 0–35)
AST: 23 U/L (ref 0–37)
Albumin: 4.4 g/dL (ref 3.5–5.2)
Alkaline Phosphatase: 46 U/L (ref 39–117)
BUN: 12 mg/dL (ref 6–23)
CO2: 25 mEq/L (ref 19–32)
Calcium: 9.5 mg/dL (ref 8.4–10.5)
Chloride: 105 mEq/L (ref 96–112)
Creatinine, Ser: 0.91 mg/dL (ref 0.40–1.20)
GFR: 80.72 mL/min (ref >60.00)
Glucose, Bld: 85 mg/dL (ref 70–99)
Potassium: 4 mEq/L (ref 3.5–5.1)
Sodium: 138 mEq/L (ref 135–145)
Total Bilirubin: 0.4 mg/dL (ref 0.2–1.2)
Total Protein: 7.5 g/dL (ref 6.0–8.3)

## 2019-11-19 LAB — LIPID PANEL
Cholesterol: 198 mg/dL (ref 0–200)
HDL: 98.5 mg/dL (ref >39.00)
LDL Cholesterol: 94 mg/dL (ref 0–99)
NonHDL: 99.63
Total CHOL/HDL Ratio: 2
Triglycerides: 28 mg/dL (ref 0.0–149.0)
VLDL: 5.6 mg/dL (ref 0.0–40.0)

## 2019-11-19 LAB — TSH: TSH: 1.76 u[IU]/mL (ref 0.35–4.50)

## 2019-11-19 NOTE — Patient Instructions (Addendum)

## 2019-11-19 NOTE — Progress Notes (Signed)
11/19/2019 2:16 PM   Chelsea Wagner 1976/01/10 [id number]  Referring provider: McLean-Scocuzza, Pasty Spillers, MD [address] [place],  [place] [address]  No chief complaint on file.   HPI:  Chelsea Wagner was referred over for urinary frequency. She had a urinalysis that was negative and a culture grew less than 10,000 gram-positive bacteria.  She had a normal pelvic exam 10/05/2019 with GYN. At the time she had some bacteria in the urine but wasn't having any symptoms. She felt "worse" p the abx. 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 She has some urgency. She has no frequency. She ordered some cranberry and kombucha.  No gross hematuria. No dysuria. No NG risk. No constipation. Drinks only water or juice. Minimize sugar. No pelvic surgery. Nulliporous but adopted a son.   PMH: Past Medical History:  Diagnosis Date  . Back pain    low back pain   . Fibrocystic breast   . History of fainting spells of unknown cause   . Infertility associated with anovulation   . Osteopenia   . 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 Premature ovarian failure   . Premature ovarian failure     Surgical History: Past Surgical History:  Procedure Laterality Date  . BREAST EXCISIONAL BIOPSY Left    age 44's  . BREAST LUMPECTOMY Left 1999  . BREAST LUMPECTOMY     benign tumor   . BREAST SURGERY      Home Medications:  Allergies as of 11/19/2019      Reactions   Sulfa Antibiotics Hives   Sulfur       Medication List       Accurate as of November 19, 2019  2:16 PM. If you have any questions, ask your nurse or doctor.        ALIVE ONCE DAILY WOMENS PO Take by mouth.   calcium citrate-vitamin D 315-200 MG-UNIT tablet Commonly known as: CITRACAL+D Take 1 tablet by mouth 2 (two) times daily.   ESOMEPRAZOLE STRONTIUM PO Take by mouth.   MULTIVITAMIN ADULT PO Take by mouth.       Allergies:  Allergies  Allergen Reactions   . Sulfa Antibiotics Hives  . Sulfur     Family History: Family History  Problem Relation Age of Onset  . Diabetes Mother   . Heart disease Mother   . Hypertension Mother   . Fibromyalgia Mother   . Other Mother        cardiac arrythmia   . Atrial fibrillation Mother   . Mental illness Father        bipolar   . Hypertension Father   . Hyperlipidemia Father   . Diabetes Father   . Kidney disease Father   . Bradycardia Father   . Dementia Father        lewy body dementia 46 died   . Breast cancer Paternal Aunt 68  . Cancer Paternal Aunt        breast dx'ed in 46s   . Other Maternal Uncle        cardiac arrythmia  . Hypertension Maternal Grandmother   . Dementia Maternal Grandmother   . Diabetes Maternal Grandmother   . Heart Problems Maternal Grandmother   . Hypertension Maternal Grandfather   . Hypertension Paternal Grandmother   . Stroke Paternal Grandmother   . Hypertension Paternal Grandfather     Social History:  reports  that she has never smoked. She has never used smokeless tobacco. She reports current alcohol use of about 1.0 standard drinks of alcohol per week. She reports that she does not use drugs.   Physical Exam: LMP 11/02/2011   Constitutional:  Alert and oriented, No acute distress. HEENT: [place] AT, moist mucus membranes.  Trachea midline, no masses. Cardiovascular: No clubbing, cyanosis, or edema. Respiratory: Normal respiratory effort, no increased work of breathing. GI: Abdomen is soft, nontender, nondistended, no abdominal masses GU: No CVA tenderness Skin: No rashes, bruises or suspicious lesions. Neurologic: Grossly intact, no focal deficits, moving all 4 extremities. Psychiatric: Normal mood and affect.  Laboratory Data: Lab Results  Component Value Date   WBC 4.1 11/19/2019   HGB 13.3 11/19/2019   HCT 39.1 11/19/2019   MCV 97.8 11/19/2019   PLT 243.0 11/19/2019    Lab Results  Component Value Date   CREATININE 0.91 11/19/2019     No results found for: PSA  No results found for: TESTOSTERONE  No results found for: HGBA1C  Urinalysis    Component Value Date/Time   COLORURINE YELLOW 10/28/2019 1537   APPEARANCEUR CLEAR 10/28/2019 1537   APPEARANCEUR Clear 08/21/2018 0816   LABSPEC 1.006 10/28/2019 1537   PHURINE 6.0 10/28/2019 1537   GLUCOSEU NEGATIVE 10/28/2019 1537   HGBUR NEGATIVE 10/28/2019 1537   BILIRUBINUR Negative 08/21/2018 0816   KETONESUR NEGATIVE 10/28/2019 1537   PROTEINUR NEGATIVE 10/28/2019 1537   UROBILINOGEN 0.2 06/11/2017 0932   NITRITE NEGATIVE 10/28/2019 1537   LEUKOCYTESUR NEGATIVE 10/28/2019 1537    Lab Results  Component Value Date   LABMICR Comment 08/21/2018    Pertinent Imaging: n/a No results found for this or any previous visit. No results found for this or any previous visit. No results found for this or any previous visit. No results found for this or any previous visit. No results found for this or any previous visit. No results found for this or any previous visit. No results found for this or any previous visit. No results found for this or any previous visit.  Assessment & Plan:    1. Bacteriuria - this sounded like an asymptomatic episode that was complicated with abx. She will continue to monitor. UA looks like some WBC but no bacteria today and no rbcs.   2. Frequency, urgency - discussed some OAB meds. She will consider.   Check PVR and symptoms in 3 months   - Urinalysis, Complete   No follow-ups on file.  Festus Aloe, MD  [facility] [address], [place], [place] [address] [phone]

## 2019-11-20 LAB — HIV ANTIBODY (ROUTINE TESTING W REFLEX): HIV 1&2 Ab, 4th Generation: NONREACTIVE

## 2019-11-22 ENCOUNTER — Ambulatory Visit: Payer: BC Managed Care – PPO | Attending: Internal Medicine

## 2019-11-22 DIAGNOSIS — Z20822 Contact with and (suspected) exposure to covid-19: Secondary | ICD-10-CM

## 2019-11-22 LAB — URINALYSIS, COMPLETE
Bilirubin, UA: NEGATIVE
Glucose, UA: NEGATIVE
Ketones, UA: NEGATIVE
Leukocytes,UA: NEGATIVE
Nitrite, UA: NEGATIVE
Protein,UA: NEGATIVE
RBC, UA: NEGATIVE
Specific Gravity, UA: 1.025 (ref 1.005–1.030)
Urobilinogen, Ur: 0.2 mg/dL (ref 0.2–1.0)
pH, UA: 7.5 (ref 5.0–7.5)

## 2019-11-22 LAB — MICROSCOPIC EXAMINATION
Bacteria, UA: NONE SEEN
RBC, Urine: NONE SEEN /hpf (ref 0–2)

## 2019-11-23 LAB — NOVEL CORONAVIRUS, NAA: SARS-CoV-2, NAA: NOT DETECTED

## 2019-11-23 LAB — SARS-COV-2, NAA 2 DAY TAT

## 2019-11-26 ENCOUNTER — Telehealth: Payer: Self-pay

## 2019-11-26 DIAGNOSIS — I493 Ventricular premature depolarization: Secondary | ICD-10-CM

## 2019-11-26 NOTE — Telephone Encounter (Signed)
Patient made aware of cardiac monitor results and Dr. Charyl Bigger recommendation. Patient is agreeable with proceeding with the recommended echo. Adv the patient that a scheduler will contact her to schedule the echo and reschedule her f/u with Dr. Azucena Cecil for after the echo.  Patient verbalized understanding.

## 2019-11-26 NOTE — Telephone Encounter (Signed)
Called to give the patient cardiac monitor results and Dr. Agbor-Etang's recommendation. [email or web address] 

## 2019-11-26 NOTE — Telephone Encounter (Signed)
ROI faxed to [facility] for women. Confirmation received 04/29 11:51 am.   ROI sent to scan today 05/07

## 2019-11-26 NOTE — Telephone Encounter (Signed)
-----   Message from Brian Agbor-Etang, MD sent at 11/26/2019  2:32 PM EDT ----- Patient with history of irregular heartbeats, frequent PVCs, 16% burden on monitor.  Please get echocardiogram with diagnosis as irregular heartbeats and frequent PVCs.  Follow-up with me after echocardiogram.  Thank you 

## 2019-11-26 NOTE — Telephone Encounter (Signed)
-----   Message from Debbe Odea, MD sent at 11/26/2019  2:32 PM EDT ----- Patient with history of irregular heartbeats, frequent PVCs, 16% burden on monitor.  Please get echocardiogram with diagnosis as irregular heartbeats and frequent PVCs.  Follow-up with me after echocardiogram.  Thank you

## 2019-12-01 ENCOUNTER — Encounter: Payer: Self-pay | Admitting: Internal Medicine

## 2019-12-03 ENCOUNTER — Ambulatory Visit: Payer: BC Managed Care – PPO | Admitting: Cardiology

## 2019-12-08 NOTE — Telephone Encounter (Signed)
Attempted to schedule no ans no vm  

## 2019-12-24 ENCOUNTER — Other Ambulatory Visit: Payer: BC Managed Care – PPO

## 2020-01-10 ENCOUNTER — Ambulatory Visit: Payer: BC Managed Care – PPO | Attending: Internal Medicine

## 2020-01-10 DIAGNOSIS — Z20822 Contact with and (suspected) exposure to covid-19: Secondary | ICD-10-CM

## 2020-01-11 LAB — SARS-COV-2, NAA 2 DAY TAT

## 2020-01-11 LAB — NOVEL CORONAVIRUS, NAA: SARS-CoV-2, NAA: NOT DETECTED

## 2020-02-10 ENCOUNTER — Other Ambulatory Visit: Payer: Self-pay

## 2020-02-10 ENCOUNTER — Ambulatory Visit (INDEPENDENT_AMBULATORY_CARE_PROVIDER_SITE_OTHER): Payer: BC Managed Care – PPO

## 2020-02-10 DIAGNOSIS — I493 Ventricular premature depolarization: Secondary | ICD-10-CM | POA: Diagnosis not present

## 2020-02-10 LAB — ECHOCARDIOGRAM COMPLETE
AR max vel: 3.55 cm2
AV Area VTI: 3.34 cm2
AV Area mean vel: 3.07 cm2
AV Mean grad: 3 mmHg
AV Peak grad: 4.2 mmHg
Ao pk vel: 1.03 m/s
Area-P 1/2: 3.63 cm2
Calc EF: 64.9 %
S' Lateral: 3.4 cm
Single Plane A2C EF: 51.8 %
Single Plane A4C EF: 71.9 %

## 2020-02-14 ENCOUNTER — Encounter: Payer: Self-pay | Admitting: Cardiology

## 2020-02-14 ENCOUNTER — Other Ambulatory Visit: Payer: Self-pay

## 2020-02-14 ENCOUNTER — Ambulatory Visit (INDEPENDENT_AMBULATORY_CARE_PROVIDER_SITE_OTHER): Payer: BC Managed Care – PPO | Admitting: Cardiology

## 2020-02-14 VITALS — BP 124/70 | HR 62 | Ht 69.0 in | Wt 155.0 lb

## 2020-02-14 DIAGNOSIS — I493 Ventricular premature depolarization: Secondary | ICD-10-CM

## 2020-02-14 DIAGNOSIS — I499 Cardiac arrhythmia, unspecified: Secondary | ICD-10-CM | POA: Diagnosis not present

## 2020-02-14 MED ORDER — METOPROLOL SUCCINATE ER 25 MG PO TB24: 25.0000 mg | ORAL_TABLET | Freq: Every day | ORAL | 5 refills | Status: DC

## 2020-02-14 NOTE — Patient Instructions (Signed)
Medication Instructions:  Your physician has recommended you make the following change in your medication:   START Metoprolol Succinate 25 mg daily. An Rx has been sent to your pharmacy.  *If you need a refill on your cardiac medications before your next appointment, please call your pharmacy*   Lab Work: None ordered If you have labs (blood work) drawn today and your tests are completely normal, you will receive your results only by: Marland Kitchen MyChart Message (if you have MyChart) OR . A paper copy in the mail If you have any lab test that is abnormal or we need to change your treatment, we will call you to review the results.   Testing/Procedures: None ordered   Follow-Up: At [facility], you and your health needs are our priority.  As part of our continuing mission to provide you with exceptional heart care, we have created designated Provider Care Teams.  These Care Teams include your primary Cardiologist (physician) and Advanced Practice Providers (APPs -  Physician Assistants and Nurse Practitioners) who all work together to provide you with the care you need, when you need it.  We recommend signing up for the patient portal called "MyChart".  Sign up information is provided on this After Visit Summary.  MyChart is used to connect with patients for Virtual Visits (Telemedicine).  Patients are able to view lab/test results, encounter notes, upcoming appointments, etc.  Non-urgent messages can be sent to your provider as well.   To learn more about what you can do with MyChart, go to [email or web address].    Your next appointment:   3 month(s)  The format for your next appointment:   In Person  Provider:    You may see Debbe Odea, MD or one of the following Advanced Practice Providers on your designated Care Team:    Nicolasa Ducking, NP  Eula Listen, PA-C  Marisue Ivan, PA-C    Other Instructions N/A

## 2020-02-14 NOTE — Progress Notes (Signed)
Cardiology Office Note:    Date:  02/14/2020   ID:  Chelsea Wagner, DOB May 30, 1976, MRN [id number]  PCP:  McLean-Scocuzza, Pasty Spillers, MD  Cardiologist:  Debbe Odea, MD  Electrophysiologist:  None   Referring MD: McLean-Scocuzza, French Ana *   Chief Complaint  Patient presents with  . OTHER    F/u echo no complaints today. Meds reviewed verbally with pt.    History of Present Illness:    Chelsea Wagner is a 44 y.o. female with no significant past medical history who presents for follow-up.  Previously seen due to irregular heartbeat and occasional PVCs noted on EKG.  Cardiac monitor was placed to evaluate for any significant arrhythmias.  Echocardiogram was ordered after cardiac monitor showed frequent PVCs.  She presents for test results.  She states doing okay, has no concerns at this time.  She still feels irregular heartbeats whenever she checks her pulse.  Denies dizziness, chest pain.  Past Medical History:  Diagnosis Date  . Back pain    low back pain   . Fibrocystic breast   . History of fainting spells of unknown cause   . Infertility associated with anovulation   . Osteopenia   . 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 Premature ovarian failure   . Premature ovarian failure     Past Surgical History:  Procedure Laterality Date  . BREAST EXCISIONAL BIOPSY Left    age 15's  . BREAST LUMPECTOMY Left 1999  . BREAST LUMPECTOMY     benign tumor   . BREAST SURGERY      Current Medications: Current Meds  Medication Sig  . calcium citrate-vitamin D (CITRACAL+D) 315-200 MG-UNIT tablet Take 1 tablet by mouth 2 (two) times daily.  . Multiple Vitamin (MULTIVITAMIN ADULT PO) Take by mouth.  . Multiple Vitamins-Minerals (ALIVE ONCE DAILY WOMENS PO) Take by mouth.  . NON FORMULARY Strontium Citrate Take 1 capsule qd.     Allergies:   Sulfa antibiotics and Sulfur   Social History   Socioeconomic History  . Marital status: Married    Spouse name: Not on file  . Number of  children: Not on file  . Years of education: Not on file  . Highest education level: Not on file  Occupational History  . Not on file  Tobacco Use  . Smoking status: Never Smoker  . Smokeless tobacco: Never Used  Substance and Sexual Activity  . Alcohol use: Yes    Alcohol/week: 1.0 standard drink    Types: 1 Standard drinks or equivalent per week  . Drug use: No  . Sexual activity: Yes    Partners: Male  Other Topics Concern  . 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 Difficulty of Paying Living Expenses:   Food Insecurity:   . Worried About Programme researcher, broadcasting/film/video in the Last Year:   . Barista in the Last Year:   Transportation Needs:   . Freight forwarder (Medical):   Marland Kitchen Lack of Transportation (Non-Medical):   Physical Activity:   . Days of Exercise per Week:   . Minutes of Exercise per Session:  Stress:   . Feeling of Stress :   Social Connections:   . Frequency of Communication with Friends and Family:   . Frequency of Social Gatherings with Friends and Family:   . Attends Religious Services:   . Active Member of Clubs or Organizations:   . Attends Banker Meetings:   Marland Kitchen Marital Status:      Family History: The patient's family history includes Atrial fibrillation in her mother; Bradycardia in her father; Breast cancer (age of onset: 61) in her paternal aunt; Cancer in her paternal aunt; Dementia in her father and maternal grandmother; Diabetes in her father, maternal grandmother, and mother; Fibromyalgia in her mother; Heart Problems in her maternal grandmother; Heart disease in her mother; Hyperlipidemia in her father; Hypertension in  her father, maternal grandfather, maternal grandmother, mother, paternal grandfather, and paternal grandmother; Kidney disease in her father; Mental illness in her father; Other in her maternal uncle and mother; Stroke in her paternal grandmother.  ROS:   Please see the history of present illness.     All other systems reviewed and are negative.  EKGs/Labs/Other Studies Reviewed:    The following studies were reviewed today:   EKG:  EKG is  ordered today.  The ekg ordered today demonstrates sinus rhythm, occasional PVC.  Recent Labs: 11/19/2019: ALT 15; BUN 12; Creatinine, Ser 0.91; Hemoglobin 13.3; Platelets 243.0; Potassium 4.0; Sodium 138; TSH 1.76  Recent Lipid Panel    Component Value Date/Time   CHOL 198 11/19/2019 0801   TRIG 28.0 11/19/2019 0801   HDL 98.50 11/19/2019 0801   CHOLHDL 2 11/19/2019 0801   VLDL 5.6 11/19/2019 0801   LDLCALC 94 11/19/2019 0801    Physical Exam:    VS:  BP 124/70 (BP Location: Left Arm, Patient Position: Sitting, Cuff Size: Normal)   Pulse 62   Ht 5\' 9"  (1.753 m)   Wt 155 lb (70.3 kg)   LMP 11/02/2011   SpO2 98%   BMI 22.89 kg/m     Wt Readings from Last 3 Encounters:  02/14/20 155 lb (70.3 kg)  11/19/19 159 lb (72.1 kg)  11/05/19 157 lb 2 oz (71.3 kg)     GEN:  Well nourished, well developed in no acute distress HEENT: Normal NECK: No JVD; No carotid bruits LYMPHATICS: No lymphadenopathy CARDIAC: Occasional skipped beats, no murmurs, rubs, gallops RESPIRATORY:  Clear to auscultation without rales, wheezing or rhonchi  ABDOMEN: Soft, non-tender, non-distended MUSCULOSKELETAL:  No edema; No deformity  SKIN: Warm and dry NEUROLOGIC:  Alert and oriented x 3 PSYCHIATRIC:  Normal affect   ASSESSMENT:    1. Frequent PVCs   2. Irregular heart beat    PLAN:    In order of problems listed above:  1. Patient with history of irregular heartbeats, frequent PVCs noted on EKG. 2-week cardiac monitor did reveal frequent PVCs, 16%  burden.  Echocardiogram obtained showed normal systolic and diastolic function, EF 60 to 65%.  We will start Toprol-XL 25 mg daily to help with patient's symptoms and also try to reduce PVC requirements.  Follow-up in 3 months.  Total encounter time 40 minutes  Greater than 50% was spent in counseling and coordination of care with the patient   This note was generated in part or whole with voice recognition software. Voice recognition is usually quite accurate but there are transcription errors that can and very often do occur. I apologize for any typographical errors that were not detected and corrected.  Medication Adjustments/Labs and  Tests Ordered: Current medicines are reviewed at length with the patient today.  Concerns regarding medicines are outlined above.  No orders of the defined types were placed in this encounter.  Meds ordered this encounter  Medications  . metoprolol succinate (TOPROL XL) 25 MG 24 hr tablet    Sig: Take 1 tablet (25 mg total) by mouth daily.    Dispense:  30 tablet    Refill:  5    Patient Instructions  Medication Instructions:  Your physician has recommended you make the following change in your medication:   START Metoprolol Succinate 25 mg daily. An Rx has been sent to your pharmacy.  *If you need a refill on your cardiac medications before your next appointment, please call your pharmacy*   Lab Work: None ordered If you have labs (blood work) drawn today and your tests are completely normal, you will receive your results only by: Marland Kitchen MyChart Message (if you have MyChart) OR . A paper copy in the mail If you have any lab test that is abnormal or we need to change your treatment, we will call you to review the results.   Testing/Procedures: None ordered   Follow-Up: At [facility], you and your health needs are our priority.  As part of our continuing mission to provide you with exceptional heart care, we have created designated Provider  Care Teams.  These Care Teams include your primary Cardiologist (physician) and Advanced Practice Providers (APPs -  Physician Assistants and Nurse Practitioners) who all work together to provide you with the care you need, when you need it.  We recommend signing up for the patient portal called "MyChart".  Sign up information is provided on this After Visit Summary.  MyChart is used to connect with patients for Virtual Visits (Telemedicine).  Patients are able to view lab/test results, encounter notes, upcoming appointments, etc.  Non-urgent messages can be sent to your provider as well.   To learn more about what you can do with MyChart, go to [email or web address].    Your next appointment:   3 month(s)  The format for your next appointment:   In Person  Provider:    You may see Debbe Odea, MD or one of the following Advanced Practice Providers on your designated Care Team:    Nicolasa Ducking, NP  Eula Listen, PA-C  Marisue Ivan, PA-C    Other Instructions N/A     Signed, Debbe Odea, MD  02/14/2020 12:51 PM    [facility]

## 2020-02-15 NOTE — Telephone Encounter (Signed)
Patient calling to check on status  Patient does not feel comfortable taking the metoprolol medication but is concerned about stopping it due to the warning on label - states she is feeling things in heart that she has never felt before  Please call to discuss ASAP

## 2020-02-16 NOTE — Addendum Note (Signed)
Addended by: Cristel Rail C on: 02/16/2020 07:37 AM   Modules accepted: Orders  

## 2020-02-23 ENCOUNTER — Ambulatory Visit: Payer: Self-pay | Admitting: Urology

## 2020-03-10 ENCOUNTER — Ambulatory Visit (INDEPENDENT_AMBULATORY_CARE_PROVIDER_SITE_OTHER): Payer: Self-pay | Admitting: Urology

## 2020-03-10 ENCOUNTER — Other Ambulatory Visit: Payer: Self-pay

## 2020-03-10 ENCOUNTER — Encounter: Payer: Self-pay | Admitting: Urology

## 2020-03-10 VITALS — BP 115/77 | HR 68 | Ht 69.0 in | Wt 155.2 lb

## 2020-03-10 DIAGNOSIS — R3915 Urgency of urination: Secondary | ICD-10-CM

## 2020-03-10 LAB — BLADDER SCAN AMB NON-IMAGING

## 2020-03-10 NOTE — Progress Notes (Signed)
03/10/2020 10:05 AM   Chelsea Wagner 08/05/1975 [id number]  Referring provider: McLean-Scocuzza, Pasty Spillers, MD [address] [place],  [place] [address]  Chief Complaint  Patient presents with   Follow-up    HPI:  F/u - urinary frequency. She had a urinalysis that was negative and a culture grew less than 10,000 gram-positive bacteria.  She had a normal pelvic exam 10/05/2019 with GYN. At the time she had some bacteria in the urine but wasn't having any symptoms. She felt "worse" p the abx. 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 She has some urgency. She has no frequency. She ordered some cranberry and kombucha.   No NG risk. No constipation. Drinks only water or juice. Minimize sugar. No pelvic surgery. Nulliporous, adopted a son.  She returns and has not had any bothersome LUTS. Sometimes she has to go and anxiety makes it worse. She's drinking more water. Bladder scan 119 ml. She did not leave a specimen. No dysuria or gross hematuria.   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 If you have any questions, ask your nurse or doctor.        STOP taking these medications   metoprolol succinate 25 MG 24 hr tablet Commonly known as: Toprol XL Stopped by: Jerilee Field, MD     TAKE these medications   ALIVE ONCE DAILY WOMENS PO Take by mouth.   calcium citrate-vitamin D 315-200 MG-UNIT tablet Commonly known as: CITRACAL+D Take 1 tablet by mouth 2 (two) times daily.   MULTIVITAMIN ADULT PO Take by mouth.   NON FORMULARY Strontium Citrate Take 1 capsule qd.       Allergies:  Allergies  Allergen Reactions   Sulfa Antibiotics Hives   Sulfur     Family History: Family History  Problem Relation Age of Onset   Diabetes Mother    Heart disease Mother    Hypertension Mother    Fibromyalgia Mother    Other Mother        cardiac arrythmia    Atrial fibrillation Mother    Mental illness Father        bipolar    Hypertension Father    Hyperlipidemia Father    Diabetes Father    Kidney disease Father    Bradycardia Father    Dementia Father        lewy body dementia 93 died    Breast cancer Paternal Aunt 60   Cancer Paternal Aunt        breast dx'ed in 51s  Other Maternal Uncle        cardiac arrythmia   Hypertension Maternal Grandmother    Dementia Maternal Grandmother    Diabetes Maternal Grandmother    Heart Problems Maternal Grandmother    Hypertension Maternal Grandfather    Hypertension Paternal Grandmother    Stroke Paternal Grandmother    Hypertension Paternal Grandfather     Social History:  reports that she has never smoked. She has never used smokeless tobacco. She reports current alcohol use of about 1.0 standard drink of alcohol per week. She reports that she does not use drugs.   Physical Exam: BP 115/77 (BP Location: Left Arm, Patient Position: Sitting, Cuff Size: Normal)    Pulse 68    Ht 5\' 9"  (1.753 m)    Wt 155 lb 3.2 oz (70.4 kg)    LMP 11/02/2011    BMI 22.92 kg/m   Constitutional:  Alert and oriented, No acute distress. HEENT: [place] AT, moist mucus membranes.  Trachea midline, no masses. Cardiovascular: No clubbing, cyanosis, or edema. Respiratory: Normal respiratory  effort, no increased work of breathing. GI: Abdomen is soft, nontender, nondistended, no abdominal masses GU: No CVA tenderness Skin: No rashes, bruises or suspicious lesions. Neurologic: Grossly intact, no focal deficits, moving all 4 extremities. Psychiatric: Normal mood and affect.  Laboratory Data: Lab Results  Component Value Date   WBC 4.1 11/19/2019   HGB 13.3 11/19/2019   HCT 39.1 11/19/2019   MCV 97.8 11/19/2019   PLT 243.0 11/19/2019    Lab Results  Component Value Date   CREATININE 0.91 11/19/2019    No results found for: PSA  No results found for: TESTOSTERONE  No results found for: HGBA1C  Urinalysis    Component Value Date/Time   COLORURINE YELLOW 10/28/2019 1537   APPEARANCEUR Clear 11/19/2019 1419   LABSPEC 1.006 10/28/2019 1537   PHURINE 6.0 10/28/2019 1537   GLUCOSEU Negative 11/19/2019 1419   HGBUR NEGATIVE 10/28/2019 1537   BILIRUBINUR Negative 11/19/2019 1419   KETONESUR NEGATIVE 10/28/2019 1537   PROTEINUR Negative 11/19/2019 1419   PROTEINUR NEGATIVE 10/28/2019 1537   UROBILINOGEN 0.2 06/11/2017 0932   NITRITE Negative 11/19/2019 1419   NITRITE NEGATIVE 10/28/2019 1537   LEUKOCYTESUR Negative 11/19/2019 1419   LEUKOCYTESUR NEGATIVE 10/28/2019 1537    Lab Results  Component Value Date   LABMICR See below: 11/19/2019   WBCUA 11-30 (A) 11/19/2019   LABEPIT 0-10 11/19/2019   BACTERIA None seen 11/19/2019    Pertinent Imaging: N/a  No results found for this or any previous visit.  No results found for this or any previous visit.  No results found for this or any previous visit.  No results found for this or any previous visit.  No results found for this or any previous visit.  No results found for this or any previous visit.  No results found for this or any previous visit.  No results found for this or any previous visit.   Assessment & Plan:    1. Urgency of urination Doing well with PT and BT on her own. Discussed  formal PT referral and she'll consider. . See in 1 year or sooner if any symptoms. - Bladder Scan (Post Void Residual) in office   No follow-ups on file.  11/21/2019, MD  [facility] [address] [place], [place] [id number]

## 2020-03-10 NOTE — Patient Instructions (Signed)
The Female Pelvic Floor  The pelvic floor consists of several layers of muscles that cover the bottom of the pelvic cavity. These muscles have several distinct roles:  1. To support the pelvic organs, the bladder, uterus and colon within the pelvis. 2. To assist in stopping and starting the flow of urine or the passage of gas or stool.To aid in sexual appreciation.  How to Locate the Pelvic Floor Muscles  The Urine Stop Test . At the midstream of your urine flow, squeeze the pelvic floor muscles. You should feel the sensation of the openings close and the muscles pulling up and into the pelvic cavity.  If you have strong muscles you will slow or stop the stream of urine. . Stop or slow the flow of urine without tensing the muscles of your legs, buttocks. . Do this only to locate the muscles, not as a daily exercise. Feeling the Muscle . Place a fingertip on the anal opening. Contract and lift the muscles as though you are holding back gas or stool. You will feel your anal opening tighten. . Insert 1 or 2 fingers into the vagina to feel the contraction and lifting of the muscles. You should feel the opening of the vagina tighten around your finger. Watching the Muscle Contract . Begin by lying on a flat surface.  Position yourself with your knees apart and bent with your head elevated and supported on several pillows. Use a mirror to look at the anal and vaginal openings and the perineal body (the area between the two openings).  . Contract or tighten the muscles around the openings and watch for a lifting of the perineal body and closure of the openings.   . If you see a bulge or feel tissues coming out of your openings, this is an incorrect contraction and you should notify your health care provider for more instructions.  2007, [employer] Doc.11  

## 2020-03-28 ENCOUNTER — Other Ambulatory Visit: Payer: BC Managed Care – PPO

## 2020-03-28 ENCOUNTER — Other Ambulatory Visit: Payer: Self-pay

## 2020-03-28 DIAGNOSIS — Z20822 Contact with and (suspected) exposure to covid-19: Secondary | ICD-10-CM

## 2020-03-30 LAB — NOVEL CORONAVIRUS, NAA: SARS-CoV-2, NAA: NOT DETECTED

## 2020-03-30 LAB — SARS-COV-2, NAA 2 DAY TAT

## 2020-05-22 ENCOUNTER — Other Ambulatory Visit: Payer: Self-pay

## 2020-05-22 ENCOUNTER — Encounter: Payer: Self-pay | Admitting: Cardiology

## 2020-05-22 ENCOUNTER — Ambulatory Visit (INDEPENDENT_AMBULATORY_CARE_PROVIDER_SITE_OTHER): Payer: Self-pay | Admitting: Cardiology

## 2020-05-22 VITALS — BP 100/70 | HR 62 | Ht 69.0 in | Wt 152.0 lb

## 2020-05-22 DIAGNOSIS — I493 Ventricular premature depolarization: Secondary | ICD-10-CM

## 2020-05-22 DIAGNOSIS — Z7189 Other specified counseling: Secondary | ICD-10-CM

## 2020-05-22 NOTE — Progress Notes (Signed)
Cardiology Office Note:    Date:  05/22/2020   ID:  Chelsea Wagner, DOB [birth year], MRN [id number]  PCP:  McLean-Scocuzza, Pasty Spillers, MD  Cardiologist:  Debbe Odea, MD  Electrophysiologist:  None   Referring MD: McLean-Scocuzza, French Ana *   Chief Complaint  Patient presents with  . other    3 month f/u no complaints today. Meds reviewed verbally with pt.    History of Present Illness:    Chelsea Wagner is a 44 y.o. female with no significant past medical history who presents for follow-up.  Previously seen due to irregular heartbeat, cardiac monitor showing frequent PVCs 16% burden.  Echo was normal.  Patient was started on Toprol-XL, but this made her feel unwell, fatigue, as such stop taking Toprol-XL.  She states her symptoms occurred around the episode of her second Covid vaccine and attributes symptoms to this.  She also states was under stress and has been doing ballroom dancing to help.  Symptoms of palpitations have since improved.  Has no concerns at this time.  Has questions about Covid 19 booster.   Prior notes Echo 01/2020 with normal systolic and diastolic function, EF 60 to 65%.  Cardiac monitor 10/2019, frequent PVCs, 16% burden  Past Medical History:  Diagnosis Date  . Back pain    low back pain   . Fibrocystic breast   . History of fainting spells of unknown cause   . Infertility associated with anovulation   . Osteopenia   . 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 Premature ovarian failure   . Premature ovarian failure     Past Surgical History:  Procedure Laterality Date  . BREAST EXCISIONAL BIOPSY Left    age 57's  . BREAST LUMPECTOMY Left 1999  . BREAST LUMPECTOMY     benign tumor   . BREAST SURGERY      Current Medications: Current Meds  Medication Sig  . calcium citrate-vitamin D (CITRACAL+D) 315-200 MG-UNIT tablet Take 1 tablet by mouth 2 (two) times daily.  . Multiple Vitamins-Minerals (ALIVE ONCE DAILY WOMENS PO) Take by mouth.  . NON FORMULARY  Strontium Citrate Take 1 capsule qd.     Allergies:   Sulfa antibiotics and Sulfur   Social History   Socioeconomic History  . Marital status: Married    Spouse name: Not on file  . Number of children: Not on file  . Years of education: Not on file  . Highest education level: Not on file  Occupational History  . Not on file  Tobacco Use  . Smoking status: Never Smoker  . Smokeless tobacco: Never Used  Substance and Sexual Activity  . Alcohol use: Yes    Alcohol/week: 1.0 standard drink    Types: 1 Standard drinks or equivalent per week  . Drug use: No  . Sexual activity: Yes    Partners: Male  Other Topics Concern  . 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 Difficulty of Paying Living Expenses: Not on file  Food Insecurity:   . Worried About Programme researcher, broadcasting/film/video in the Last Year: Not on file  . Ran Out of Food in  the Last Year: Not on file  Transportation Needs:   . Lack of Transportation (Medical): Not on file  . Lack of Transportation (Non-Medical): Not on file  Physical Activity:   . Days of Exercise per Week: Not on file  . Minutes of Exercise per Session: Not on file  Stress:   . Feeling of Stress : Not on file  Social Connections:   . Frequency of Communication with Friends and Family: Not on file  . Frequency of Social Gatherings with Friends and Family: Not on file  . Attends Religious Services: Not on file  . Active Member of Clubs or Organizations: Not on file  . Attends Banker Meetings: Not on file  . Marital Status: Not on file     Family History: The patient's family history includes Atrial fibrillation in her mother;  Bradycardia in her father; Breast cancer (age of onset: 21) in her paternal aunt; Cancer in her paternal aunt; Dementia in her father and maternal grandmother; Diabetes in her father, maternal grandmother, and mother; Fibromyalgia in her mother; Heart Problems in her maternal grandmother; Heart disease in her mother; Hyperlipidemia in her father; Hypertension in her father, maternal grandfather, maternal grandmother, mother, paternal grandfather, and paternal grandmother; Kidney disease in her father; Mental illness in her father; Other in her maternal uncle and mother; Stroke in her paternal grandmother.  ROS:   Please see the history of present illness.     All other systems reviewed and are negative.  EKGs/Labs/Other Studies Reviewed:    The following studies were reviewed today:   EKG:  EKG is  ordered today.  The ekg ordered today demonstrates sinus rhythm, normal ECG  Recent Labs: 11/19/2019: ALT 15; BUN 12; Creatinine, Ser 0.91; Hemoglobin 13.3; Platelets 243.0; Potassium 4.0; Sodium 138; TSH 1.76  Recent Lipid Panel    Component Value Date/Time   CHOL 198 11/19/2019 0801   TRIG 28.0 11/19/2019 0801   HDL 98.50 11/19/2019 0801   CHOLHDL 2 11/19/2019 0801   VLDL 5.6 11/19/2019 0801   LDLCALC 94 11/19/2019 0801    Physical Exam:    VS:  BP 100/70 (BP Location: Left Arm, Patient Position: Sitting, Cuff Size: Normal)   Pulse 62   Ht 5\' 9"  (1.753 m)   Wt 152 lb (68.9 kg)   LMP 11/02/2011   SpO2 99%   BMI 22.45 kg/m     Wt Readings from Last 3 Encounters:  05/22/20 152 lb (68.9 kg)  03/10/20 155 lb 3.2 oz (70.4 kg)  02/14/20 155 lb (70.3 kg)     GEN:  Well nourished, well developed in no acute distress HEENT: Normal NECK: No JVD; No carotid bruits LYMPHATICS: No lymphadenopathy CARDIAC: Occasional skipped beats, no murmurs, rubs, gallops RESPIRATORY:  Clear to auscultation without rales, wheezing or rhonchi  ABDOMEN: Soft, non-tender, non-distended MUSCULOSKELETAL:   No edema; No deformity  SKIN: Warm and dry NEUROLOGIC:  Alert and oriented x 3 PSYCHIATRIC:  Normal affect   ASSESSMENT:    1. Frequent PVCs   2. Educated about COVID-19 virus infection    PLAN:    In order of problems listed above:  1. Patient with history of irregular heartbeats, frequent PVCs, 16% burden on cardiac monitor 10/2019.  Echocardiogram 01/2020 showed normal systolic and diastolic function, EF 60 to 65%.  Did not tolerate Toprol-XL.  Symptoms overall improved since starting stress mediating exercising.  Continue to monitor symptoms off medications. 2. Patient counseled as to the benefits of Covid 19  vaccine and booster.    Follow-up in 103yr  Total encounter time 40 minutes  Greater than 50% was spent in counseling and coordination of care with the patient   This note was generated in part or whole with voice recognition software. Voice recognition is usually quite accurate but there are transcription errors that can and very often do occur. I apologize for any typographical errors that were not detected and corrected.  Medication Adjustments/Labs and Tests Ordered: Current medicines are reviewed at length with the patient today.  Concerns regarding medicines are outlined above.  No orders of the defined types were placed in this encounter.  No orders of the defined types were placed in this encounter.   Patient Instructions  Medication Instructions:  Your physician recommends that you continue on your current medications as directed. Please refer to the Current Medication list given to you today.  *If you need a refill on your cardiac medications before your next appointment, please call your pharmacy*   Lab Work: None Ordered If you have labs (blood work) drawn today and your tests are completely normal, you will receive your results only by: Marland Kitchen MyChart Message (if you have MyChart) OR . A paper copy in the mail If you have any lab test that is abnormal or we  need to change your treatment, we will call you to review the results.   Testing/Procedures: None Ordered   Follow-Up: At [facility], you and your health needs are our priority.  As part of our continuing mission to provide you with exceptional heart care, we have created designated Provider Care Teams.  These Care Teams include your primary Cardiologist (physician) and Advanced Practice Providers (APPs -  Physician Assistants and Nurse Practitioners) who all work together to provide you with the care you need, when you need it.  We recommend signing up for the patient portal called "MyChart".  Sign up information is provided on this After Visit Summary.  MyChart is used to connect with patients for Virtual Visits (Telemedicine).  Patients are able to view lab/test results, encounter notes, upcoming appointments, etc.  Non-urgent messages can be sent to your provider as well.   To learn more about what you can do with MyChart, go to [email or web address].    Your next appointment:   1 year(s)  The format for your next appointment:   In Person  Provider:   Debbe Odea, MD   Other Instructions      Signed, Debbe Odea, MD  05/22/2020 12:23 PM    [facility]

## 2020-05-22 NOTE — Patient Instructions (Signed)

## 2020-05-24 NOTE — Addendum Note (Signed)
Addended by: Kendrick Fries on: 05/24/2020 03:40 PM   Modules accepted: Orders

## 2020-06-27 IMAGING — MG DIGITAL SCREENING BILATERAL MAMMOGRAM WITH TOMO AND CAD
6 of 10 series · 6 of 30 positions shown · non-contrast
Comparison: Previous exam(s).

CLINICAL DATA: Screening.

EXAM:
DIGITAL SCREENING BILATERAL MAMMOGRAM WITH TOMO AND CAD

[L MLO synth-2D (1 of 2)]
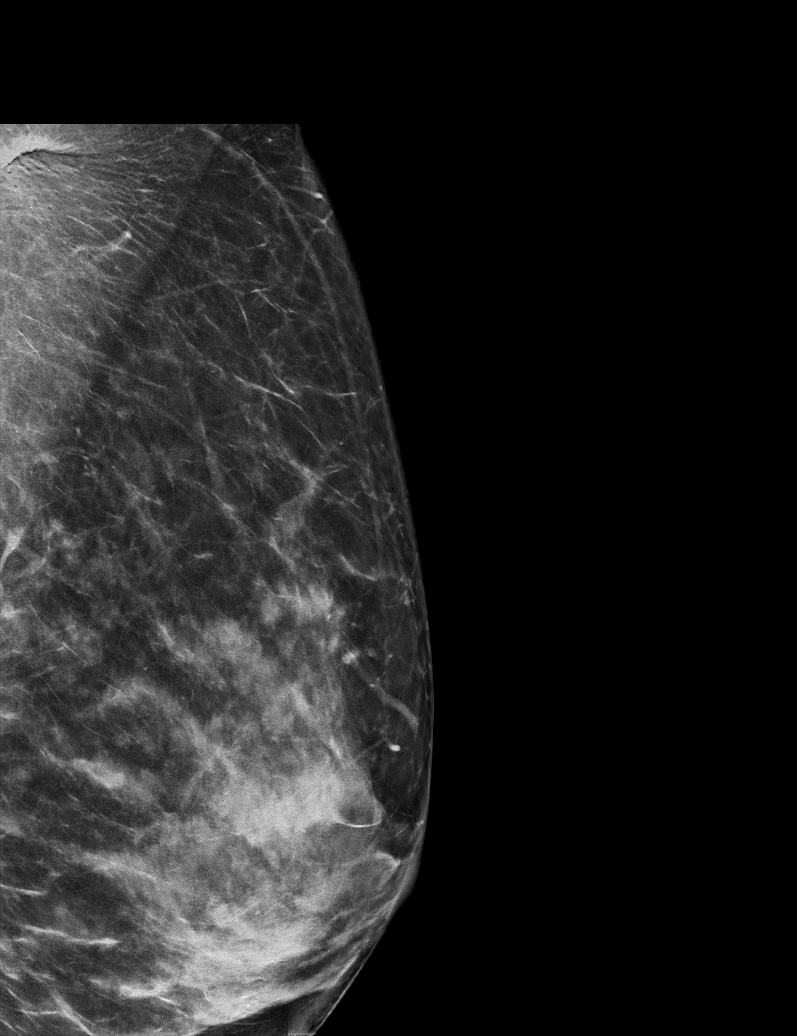

[R CC synth-2D]
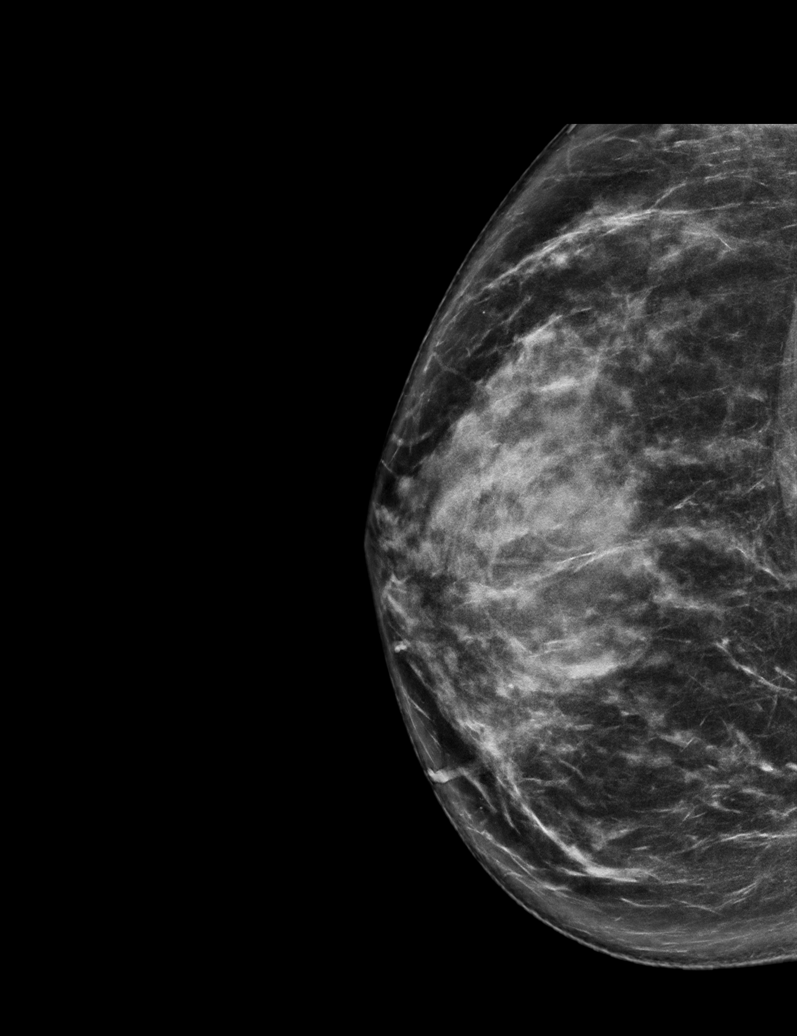

[L CC synth-2D]
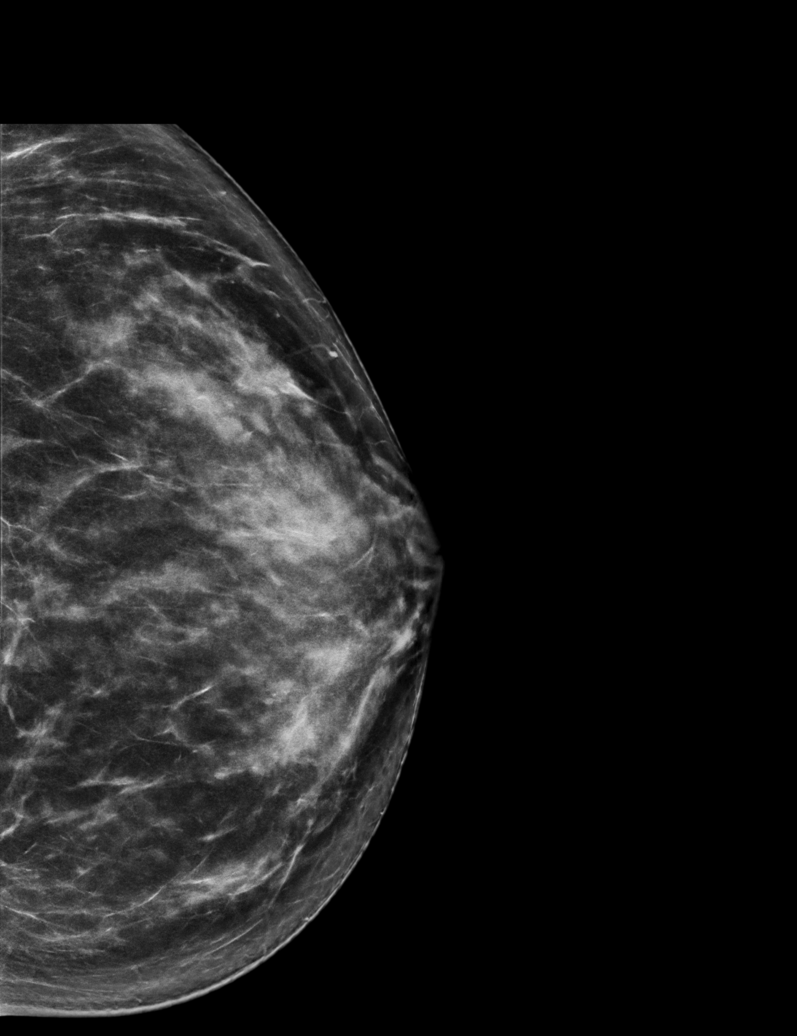

[L MLO synth-2D (2 of 2)]
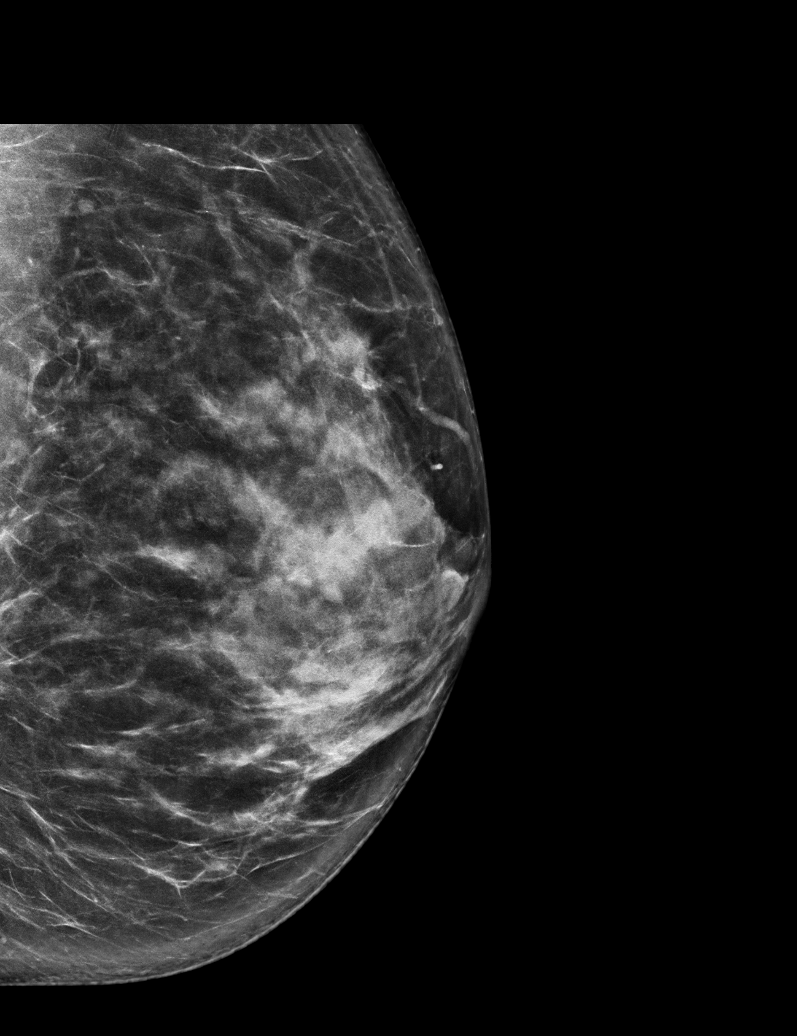

[R MLO synth-2D]
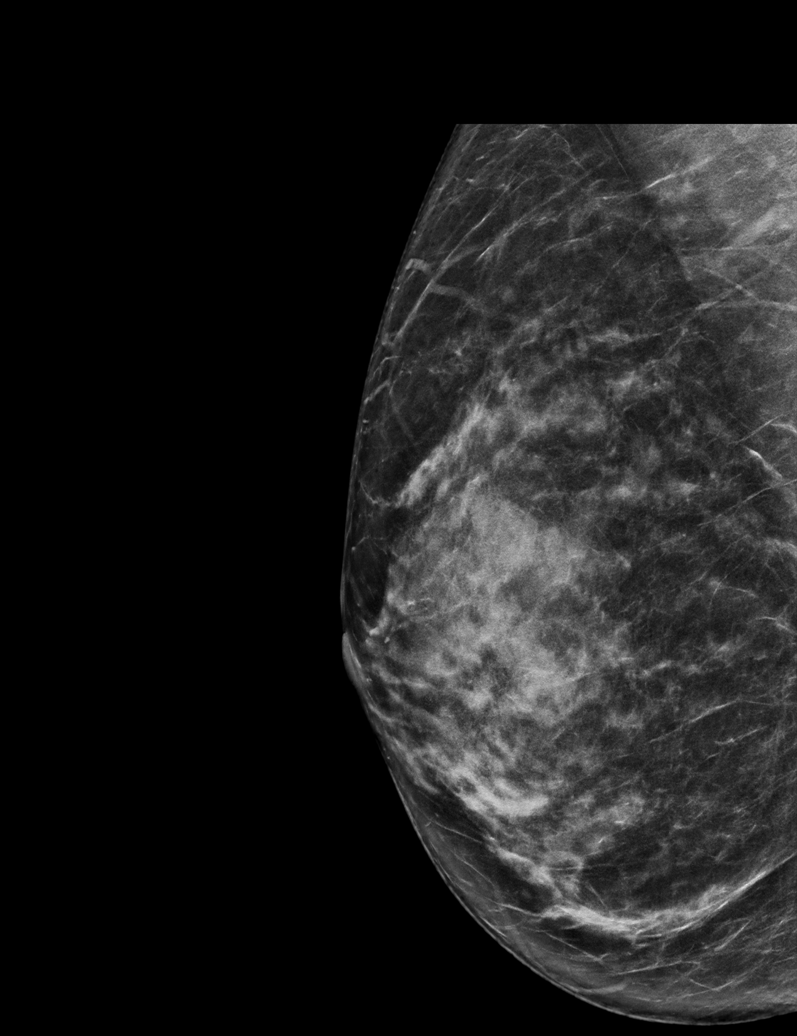

[L CC tomo · tomo slice 37/73.0]
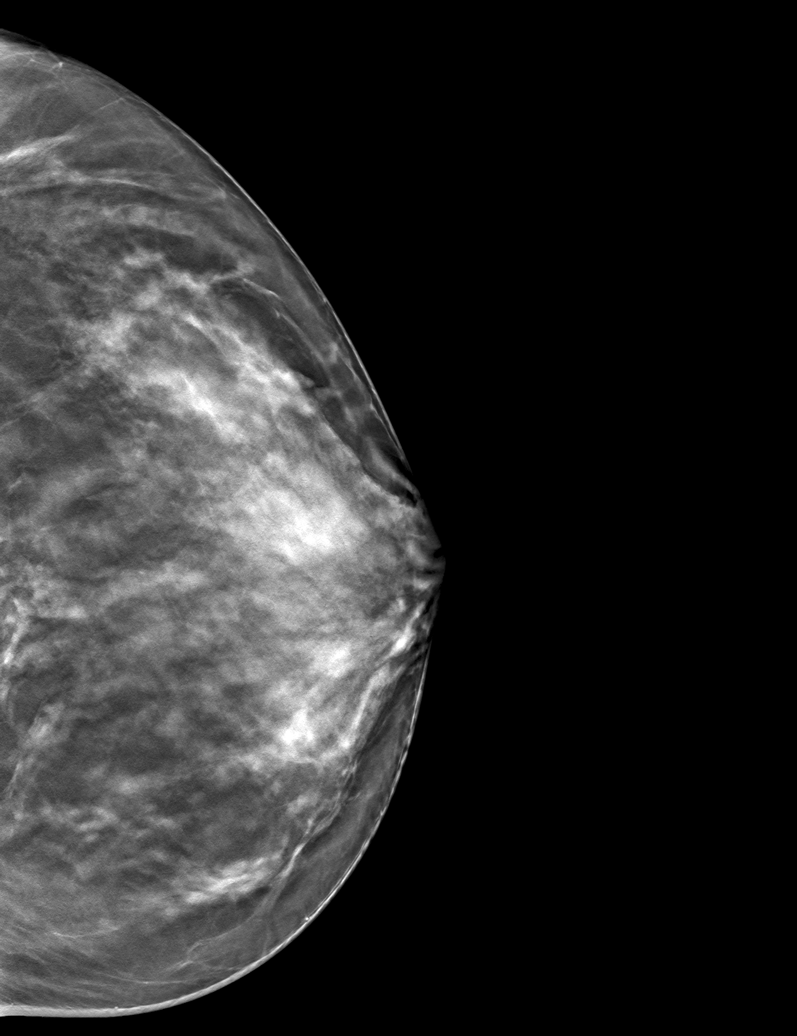

[6 of 30 positions shown; findings below may reference images not displayed]

ACR Breast Density Category c: The breast tissue is heterogeneously
dense, which may obscure small masses.
FINDINGS: There are no findings suspicious for malignancy. Images were
processed with CAD.
IMPRESSION: No mammographic evidence of malignancy. A result letter of this
screening mammogram will be mailed directly to the patient.

RECOMMENDATION:
Screening mammogram in one year. (Code:[id number])

BI-RADS CATEGORY  1: Negative.

## 2020-07-24 ENCOUNTER — Ambulatory Visit: Payer: Self-pay | Attending: Internal Medicine

## 2020-07-24 DIAGNOSIS — Z23 Encounter for immunization: Secondary | ICD-10-CM

## 2020-07-24 NOTE — Progress Notes (Signed)
   Covid-19 Vaccination Clinic  Name:  Chelsea Wagner    MRN: [id number] DOB: [birth year]  07/24/2020  [name] was observed post Covid-19 immunization for 15 minutes without incident. She was provided with Vaccine Information Sheet and instruction to access the V-Safe system.   [name] was instructed to call 911 with any severe reactions post vaccine: Marland Kitchen Difficulty breathing  . Swelling of face and throat  . A fast heartbeat  . A bad rash all over body  . Dizziness and weakness   Immunizations Administered    Name Date Dose VIS Date Route   Pfizer COVID-19 Vaccine 07/24/2020  2:33 PM 0.3 mL 05/10/2020 Intramuscular   Manufacturer: [employer], [employer]   Lot: [id number]   NDC: [address]-5

## 2020-08-25 ENCOUNTER — Encounter: Payer: BC Managed Care – PPO | Admitting: Internal Medicine

## 2020-08-25 ENCOUNTER — Ambulatory Visit (INDEPENDENT_AMBULATORY_CARE_PROVIDER_SITE_OTHER): Payer: Managed Care, Other (non HMO) | Admitting: Internal Medicine

## 2020-08-25 ENCOUNTER — Encounter: Payer: Self-pay | Admitting: Internal Medicine

## 2020-08-25 VITALS — BP 110/70 | HR 60 | Ht 68.7 in | Wt 148.2 lb

## 2020-08-25 DIAGNOSIS — H00014 Hordeolum externum left upper eyelid: Secondary | ICD-10-CM | POA: Diagnosis not present

## 2020-08-25 DIAGNOSIS — Z1231 Encounter for screening mammogram for malignant neoplasm of breast: Secondary | ICD-10-CM

## 2020-08-25 DIAGNOSIS — E785 Hyperlipidemia, unspecified: Secondary | ICD-10-CM

## 2020-08-25 DIAGNOSIS — Z1389 Encounter for screening for other disorder: Secondary | ICD-10-CM

## 2020-08-25 DIAGNOSIS — Z1329 Encounter for screening for other suspected endocrine disorder: Secondary | ICD-10-CM

## 2020-08-25 DIAGNOSIS — E559 Vitamin D deficiency, unspecified: Secondary | ICD-10-CM

## 2020-08-25 DIAGNOSIS — Z13220 Encounter for screening for lipoid disorders: Secondary | ICD-10-CM

## 2020-08-25 DIAGNOSIS — Z23 Encounter for immunization: Secondary | ICD-10-CM | POA: Diagnosis not present

## 2020-08-25 DIAGNOSIS — Z0000 Encounter for general adult medical examination without abnormal findings: Secondary | ICD-10-CM

## 2020-08-25 MED ORDER — ERYTHROMYCIN 5 MG/GM OP OINT: 1.0000 "application " | TOPICAL_OINTMENT | Freq: Three times a day (TID) | OPHTHALMIC | 0 refills | Status: DC

## 2020-08-25 NOTE — Progress Notes (Signed)
Chief Complaint  Patient presents with  . Annual Exam   F/u  1. Left upper eyelid stye since 04/2020 nothing tried she thinks was provoked by stress otherwise feeling well here for CPE but billed 10/2019 and she is a little early will do fasting labs 11/18/20 and f/u in 1 year for billing for CPe Will refer to eye MD to address stye  2. Mammogram due referral    Review of Systems  Constitutional: Negative for weight loss.  HENT: Negative for hearing loss.   Eyes: Negative for blurred vision.  Respiratory: Negative for shortness of breath.   Cardiovascular: Negative for chest pain.  Gastrointestinal: Negative for abdominal pain.  Genitourinary: Negative for dysuria.  Musculoskeletal: Negative for falls.  Skin: Negative for rash.  Neurological: Negative for headaches.  Psychiatric/Behavioral: Negative for depression.   Past Medical History:  Diagnosis Date  . Back pain    low back pain   . Fibrocystic breast   . History of fainting spells of unknown cause   . Infertility associated with anovulation   . Osteopenia   . 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 Premature ovarian failure   . Premature ovarian failure    Past Surgical History:  Procedure Laterality Date  . BREAST EXCISIONAL BIOPSY Left    age 84's  . BREAST LUMPECTOMY Left 1999  . BREAST LUMPECTOMY     benign tumor   . BREAST SURGERY     Family History  Problem Relation Age of Onset  . Diabetes Mother   . Heart disease Mother   . Hypertension Mother   . Fibromyalgia Mother   . Other Mother        cardiac arrythmia   . Atrial fibrillation Mother   . Mental illness Father        bipolar   . Hypertension Father   . Hyperlipidemia Father   . Diabetes Father   . Kidney disease Father   . Bradycardia Father   . Dementia Father        lewy body dementia 65 died   . Breast cancer Paternal Aunt 67  . Cancer Paternal Aunt        breast dx'ed in 15s   . Other Maternal Uncle        cardiac arrythmia  . Hypertension  Maternal Grandmother   . Dementia Maternal Grandmother   . Diabetes Maternal Grandmother   . Heart Problems Maternal Grandmother   . Hypertension Maternal Grandfather   . Hypertension Paternal Grandmother   . Stroke Paternal Grandmother   . Hypertension Paternal Grandfather    Social History   Socioeconomic History  . Marital status: Married    Spouse name: Not on file  . Number of children: Not on file  . Years of education: Not on file  . Highest education level: Not on file  Occupational History  . Not on file  Tobacco Use  . Smoking status: Never Smoker  . Smokeless tobacco: Never Used  Substance and Sexual Activity  . Alcohol use: Yes    Alcohol/week: 1.0 standard drink    Types: 1 Standard drinks or equivalent per week  . Drug use: No  . Sexual activity: Yes    Partners: Male  Other Topics Concern  . 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 calcium citrate-vitamin D (CITRACAL+D) 315-200 MG-UNIT tablet Take 1 tablet by mouth 2 (two) times daily.  Marland Kitchen erythromycin ophthalmic ointment Place 1 application into the left eye 3 (three) times daily. X 4 days to 1 week  . Multiple Vitamins-Minerals (ALIVE ONCE DAILY WOMENS PO) Take by mouth.  . NON FORMULARY Strontium Citrate Take 1 capsule qd.   Allergies  Allergen Reactions  . Elemental Sulfur   . Sulfa Antibiotics Hives   No results found for this or any previous visit (from  the past 2160 hour(s)). Objective  Body mass index is 22.08 kg/m. Wt Readings from Last 3 Encounters:  08/25/20 148 lb 3.2 oz (67.2 kg)  05/22/20 152 lb (68.9 kg)  03/10/20 155 lb 3.2 oz (70.4 kg)   Temp Readings from Last 3 Encounters:  10/28/19 (!) 96.6 F (35.9 C) (Temporal)  01/06/19 (!) 97.1 F (36.2 C)  08/20/18 98.4 F (36.9 C) (Oral)   BP Readings from Last 3 Encounters:  08/25/20 110/70  05/22/20 100/70  03/10/20 115/77   Pulse Readings from Last 3 Encounters:  08/25/20 60  05/22/20 62  03/10/20 68    Physical Exam Vitals and nursing note reviewed.  Constitutional:      Appearance: Normal appearance. She is well-developed and well-groomed.  HENT:     Head: Normocephalic and atraumatic.  Eyes:     Conjunctiva/sclera: Conjunctivae normal.     Pupils: Pupils are equal, round, and reactive to light.  Cardiovascular:     Rate and Rhythm: Normal rate and regular rhythm.     Heart sounds: Normal heart sounds. No murmur heard.     Comments: +pvcs Pulmonary:     Effort: Pulmonary effort is normal.     Breath sounds: Normal breath sounds.  Abdominal:     General: Abdomen is flat. Bowel sounds are normal.     Tenderness: There is no abdominal tenderness.  Skin:    General: Skin is warm and dry.  Neurological:     General: No focal deficit present.     Mental Status: She is alert and oriented to person, place, and time. Mental status is at baseline.     Gait: Gait normal.  Psychiatric:        Attention and Perception: Attention and perception normal.        Mood and Affect: Mood and affect normal.        Speech: Speech normal.        Behavior: Behavior normal. Behavior is cooperative.        Thought Content: Thought content normal.        Cognition and Memory: Cognition and memory normal.        Judgment: Judgment normal.     Assessment  Plan  Hordeolum externum of left upper eyelid - Plan: Ambulatory referral to Ophthalmology Compress warm  E mycin  oint   Hyperlipidemia history 06/11/17 but controlled now Check fasting labs 10/2020   HM-CPE due after 10/2020  Flu shot given today Tdap due 10/2020  rec MMR vx  Hep B  immune  Hep C declines check  covid shot 3/3 1 day after 3rd dose tired, fever, chills x 24 hrs, nausea x 2 days  rec healthy diet and exercise  mammo 1/21/21normal [place] ordered 2022 by PCP (me)  Colonoscopy disc today pt declines consider in future pt will call back as of 08/25/20 [facility]  DEXA 07/2018 T score -2.4 need to get report from [facility] rec mvt and vitamin D3 2000 extra rec +1000 in Citracal/1200 calcium Pap [facility] due 2022 and DEXA   Provider: Dr. Olivia Mackie McLean-Scocuzza-Internal Medicine

## 2020-08-25 NOTE — Patient Instructions (Addendum)
[facility] or [facility]-let me know  Call [facility] ?bone density, mammogram and pap  2/21 sch mammogram  Tdap vaccine     Stye A stye, also known as a hordeolum, is a bump that forms on an eyelid. It may look like a pimple next to the eyelash. A stye can form inside the eyelid (internal stye) or outside the eyelid (external stye). A stye can cause redness, swelling, and pain on the eyelid. Styes are very common. Anyone can get them at any age. They usually occur in just one eye, but you may have more than one in either eye. What are the causes? A stye is caused by an infection. The infection is almost always caused by bacteria called Staphylococcus aureus. This is a common type of bacteria that lives on the skin. An internal stye may result from an infected oil-producing gland inside the eyelid. An external stye may be caused by an infection at the base of the eyelash (hair follicle). What increases the risk? You are more likely to develop a stye if:  You have had a stye before.  You have any of these conditions: ? Diabetes. ? Red, itchy, inflamed eyelids (blepharitis). ? A skin condition such as seborrheic dermatitis or rosacea. ? High fat levels in your blood (lipids). What are the signs or symptoms? The most common symptom of a stye is eyelid pain. Internal styes are more painful than external styes. Other symptoms may include:  Painful swelling of your eyelid.  A scratchy feeling in your eye.  Tearing and redness of your eye.  Pus draining from the stye.   How is this diagnosed? Your health care provider may be able to diagnose a stye just by examining your eye. The health care provider may also check to make sure:  You do not have a fever or other signs of a more serious infection.  The infection has not spread to other parts of your eye or areas around your eye. How is this treated? Most styes will clear up in a few days without treatment or with warm compresses applied to  the area. You may need to use antibiotic drops or ointment to treat an infection. In some cases, if your stye does not heal with routine treatment, your health care provider may drain pus from the stye using a thin blade or needle. This may be done if the stye is large, causing a lot of pain, or affecting your vision. Follow these instructions at home:  Take over-the-counter and prescription medicines only as told by your health care provider. This includes eye drops or ointments.  If you were prescribed an antibiotic medicine, apply or use it as told by your health care provider. Do not stop using the antibiotic even if your condition improves.  Apply a warm, wet cloth (warm compress) to your eye for 5-10 minutes, 4 times a day.  Clean the affected eyelid as directed by your health care provider.  Do not wear contact lenses or eye makeup until your stye has healed.  Do not try to pop or drain the stye.  Do not rub your eye. Contact a health care provider if:  You have chills or a fever.  Your stye does not go away after several days.  Your stye affects your vision.  Your eyeball becomes swollen, red, or painful. Get help right away if:  You have pain when moving your eye around. Summary  A stye is a bump that forms on  an eyelid. It may look like a pimple next to the eyelash.  A stye can form inside the eyelid (internal stye) or outside the eyelid (external stye). A stye can cause redness, swelling, and pain on the eyelid.  Your health care provider may be able to diagnose a stye just by examining your eye.  Apply a warm, wet cloth (warm compress) to your eye for 5-10 minutes, 4 times a day. This information is not intended to replace advice given to you by your health care provider. Make sure you discuss any questions you have with your health care provider. Document Revised: 03/16/2020 Document Reviewed: 03/16/2020 Elsevier Patient Education  2021 Elsevier  Inc.  Colonoscopy, Adult A colonoscopy is a procedure to look at the entire large intestine. This procedure is done using a long, thin, flexible tube that has a camera on the end. You may have a colonoscopy:  As a part of normal colorectal screening.  If you have certain symptoms, such as: ? A low number of red blood cells in your blood (anemia). ? Diarrhea that does not go away. ? Pain in your abdomen. ? Blood in your stool. A colonoscopy can help screen for and diagnose medical problems, including:  Tumors.  Extra tissue that grows where mucus forms (polyps).  Inflammation.  Areas of bleeding. Tell your health care provider about:  Any allergies you have.  All medicines you are taking, including vitamins, herbs, eye drops, creams, and over-the-counter medicines.  Any problems you or family members have had with anesthetic medicines.  Any blood disorders you have.  Any surgeries you have had.  Any medical conditions you have.  Any problems you have had with having bowel movements.  Whether you are pregnant or may be pregnant. What are the risks? Generally, this is a safe procedure. However, problems may occur, including:  Bleeding.  Damage to your intestine.  Allergic reactions to medicines given during the procedure.  Infection. This is rare. What happens before the procedure? Eating and drinking restrictions Follow instructions from your health care provider about eating or drinking restrictions, which may include:  A few days before the procedure: ? Follow a low-fiber diet. ? Avoid nuts, seeds, dried fruit, raw fruits, and vegetables.  1-3 days before the procedure: ? Eat only gelatin dessert or ice pops. ? Drink only clear liquids, such as water, clear juice, clear broth or bouillon, black coffee or tea, or clear soft drinks or sports drinks. ? Avoid liquids that contain red or purple dye.  The day of the procedure: ? Do not eat solid foods. You may  continue to drink clear liquids until up to 2 hours before the procedure. ? Do not eat or drink anything starting 2 hours before the procedure, or within the time period that your health care provider recommends. Bowel prep If you were prescribed a bowel prep to take by mouth (orally) to clean out your colon:  Take it as told by your health care provider. Starting the day before your procedure, you will need to drink a large amount of liquid medicine. The liquid will cause you to have many bowel movements of loose stool until your stool becomes almost clear or light green.  If your skin or the opening between the buttocks (anus) gets irritated from diarrhea, you may relieve the irritation using: ? Wipes with medicine in them, such as adult wet wipes with aloe and vitamin E. ? A product to soothe skin, such as petroleum jelly.  If you vomit while drinking the bowel prep: ? Take a break for up to 60 minutes. ? Begin the bowel prep again. ? Call your health care provider if you keep vomiting or you cannot take the bowel prep without vomiting.  To clean out your colon, you may also be given: ? Laxative medicines. These help you have a bowel movement. ? Instructions for enema use. An enema is liquid medicine injected into your rectum. Medicines Ask your health care provider about:  Changing or stopping your regular medicines or supplements. This is especially important if you are taking iron supplements, diabetes medicines, or blood thinners.  Taking medicines such as aspirin and ibuprofen. These medicines can thin your blood. Do not take these medicines unless your health care provider tells you to take them.  Taking over-the-counter medicines, vitamins, herbs, and supplements. General instructions  Ask your health care provider what steps will be taken to help prevent infection. These may include washing skin with a germ-killing soap.  Plan to have someone take you home from the hospital  or clinic. What happens during the procedure?  An IV will be inserted into one of your veins.  You may be given one or more of the following: ? A medicine to help you relax (sedative). ? A medicine to numb the area (local anesthetic). ? A medicine to make you fall asleep (general anesthetic). This is rarely needed.  You will lie on your side with your knees bent.  The tube will: ? Have oil or gel put on it (be lubricated). ? Be inserted into your anus. ? Be gently eased through all parts of your large intestine.  Air will be sent into your colon to keep it open. This may cause some pressure or cramping.  Images will be taken with the camera and will appear on a screen.  A small tissue sample may be removed to be looked at under a microscope (biopsy). The tissue may be sent to a lab for testing if any signs of problems are found.  If small polyps are found, they may be removed and checked for cancer cells.  When the procedure is finished, the tube will be removed. The procedure may vary among health care providers and hospitals.   What happens after the procedure?  Your blood pressure, heart rate, breathing rate, and blood oxygen level will be monitored until you leave the hospital or clinic.  You may have a small amount of blood in your stool.  You may pass gas and have mild cramping or bloating in your abdomen. This is caused by the air that was used to open your colon during the exam.  Do not drive for 24 hours after the procedure.  It is up to you to get the results of your procedure. Ask your health care provider, or the department that is doing the procedure, when your results will be ready. Summary  A colonoscopy is a procedure to look at the entire large intestine.  Follow instructions from your health care provider about eating and drinking before the procedure.  If you were prescribed an oral bowel prep to clean out your colon, take it as told by your health care  provider.  During the colonoscopy, a flexible tube with a camera on its end is inserted into the anus and then passed into the other parts of the large intestine. This information is not intended to replace advice given to you by your health care provider. Make sure  you discuss any questions you have with your health care provider. Document Revised: 01/29/2019 Document Reviewed: 01/29/2019 Elsevier Patient Education  2021 [employer].

## 2020-08-30 ENCOUNTER — Encounter: Payer: Self-pay | Admitting: Internal Medicine

## 2020-09-26 ENCOUNTER — Other Ambulatory Visit: Payer: Self-pay

## 2020-09-26 ENCOUNTER — Ambulatory Visit
Admission: RE | Admit: 2020-09-26 | Discharge: 2020-09-26 | Disposition: A | Discharge status: Home / Self Care | Payer: Managed Care, Other (non HMO) | Source: Ambulatory Visit | Attending: Internal Medicine | Admitting: Internal Medicine

## 2020-09-26 DIAGNOSIS — Z1231 Encounter for screening mammogram for malignant neoplasm of breast: Secondary | ICD-10-CM | POA: Diagnosis present

## 2020-10-09 LAB — HM PAP SMEAR: HM Pap smear: NORMAL

## 2020-11-21 ENCOUNTER — Ambulatory Visit (INDEPENDENT_AMBULATORY_CARE_PROVIDER_SITE_OTHER): Payer: Managed Care, Other (non HMO)

## 2020-11-21 ENCOUNTER — Other Ambulatory Visit: Payer: Self-pay

## 2020-11-21 DIAGNOSIS — Z23 Encounter for immunization: Secondary | ICD-10-CM

## 2020-11-21 DIAGNOSIS — Z0000 Encounter for general adult medical examination without abnormal findings: Secondary | ICD-10-CM

## 2020-11-21 DIAGNOSIS — E559 Vitamin D deficiency, unspecified: Secondary | ICD-10-CM

## 2020-11-21 DIAGNOSIS — Z13220 Encounter for screening for lipoid disorders: Secondary | ICD-10-CM

## 2020-11-21 DIAGNOSIS — Z1329 Encounter for screening for other suspected endocrine disorder: Secondary | ICD-10-CM | POA: Diagnosis not present

## 2020-11-21 DIAGNOSIS — Z1389 Encounter for screening for other disorder: Secondary | ICD-10-CM

## 2020-11-21 LAB — COMPREHENSIVE METABOLIC PANEL
ALT: 14 U/L (ref 0–35)
AST: 19 U/L (ref 0–37)
Albumin: 4.4 g/dL (ref 3.5–5.2)
Alkaline Phosphatase: 57 U/L (ref 39–117)
BUN: 14 mg/dL (ref 6–23)
CO2: 27 mEq/L (ref 19–32)
Calcium: 9.6 mg/dL (ref 8.4–10.5)
Chloride: 103 mEq/L (ref 96–112)
Creatinine, Ser: 0.86 mg/dL (ref 0.40–1.20)
GFR: 80.98 mL/min (ref >60.00)
Glucose, Bld: 78 mg/dL (ref 70–99)
Potassium: 4.1 mEq/L (ref 3.5–5.1)
Sodium: 139 mEq/L (ref 135–145)
Total Bilirubin: 0.4 mg/dL (ref 0.2–1.2)
Total Protein: 7.2 g/dL (ref 6.0–8.3)

## 2020-11-21 LAB — CBC WITH DIFFERENTIAL/PLATELET
Basophils Absolute: 0 10*3/uL (ref 0.0–0.1)
Basophils Relative: 0.8 % (ref 0.0–3.0)
Eosinophils Absolute: 0.1 10*3/uL (ref 0.0–0.7)
Eosinophils Relative: 2.1 % (ref 0.0–5.0)
HCT: 39 % (ref 36.0–46.0)
Hemoglobin: 13.3 g/dL (ref 12.0–15.0)
Lymphocytes Relative: 40.1 % (ref 12.0–46.0)
Lymphs Abs: 1.7 10*3/uL (ref 0.7–4.0)
MCHC: 34.1 g/dL (ref 30.0–36.0)
MCV: 96.7 fl (ref 78.0–100.0)
Monocytes Absolute: 0.3 10*3/uL (ref 0.1–1.0)
Monocytes Relative: 6.6 % (ref 3.0–12.0)
Neutro Abs: 2.2 10*3/uL (ref 1.4–7.7)
Neutrophils Relative %: 50.4 % (ref 43.0–77.0)
Platelets: 247 10*3/uL (ref 150.0–400.0)
RBC: 4.03 Mil/uL (ref 3.87–5.11)
RDW: 13.9 % (ref 11.5–15.5)
WBC: 4.3 10*3/uL (ref 4.0–10.5)

## 2020-11-21 LAB — LIPID PANEL
Cholesterol: 215 mg/dL — ABNORMAL HIGH (ref 0–200)
HDL: 99.9 mg/dL (ref >39.00)
LDL Cholesterol: 108 mg/dL — ABNORMAL HIGH (ref 0–99)
NonHDL: 114.69
Total CHOL/HDL Ratio: 2
Triglycerides: 33 mg/dL (ref 0.0–149.0)
VLDL: 6.6 mg/dL (ref 0.0–40.0)

## 2020-11-21 LAB — TSH: TSH: 2.08 u[IU]/mL (ref 0.35–4.50)

## 2020-11-21 LAB — VITAMIN D 25 HYDROXY (VIT D DEFICIENCY, FRACTURES): VITD: 44.7 ng/mL (ref 30.00–100.00)

## 2020-11-21 NOTE — Progress Notes (Signed)
Patient presented for TDAP injection to left deltoid, patient voiced no concerns nor showed any signs of distress during injection. 

## 2020-11-21 NOTE — Addendum Note (Signed)
Addended by: Warden Fillers on: 11/21/2020 09:26 AM   Modules accepted: Orders

## 2020-11-22 LAB — URINALYSIS, ROUTINE W REFLEX MICROSCOPIC
Bilirubin, UA: NEGATIVE
Glucose, UA: NEGATIVE
Ketones, UA: NEGATIVE
Leukocytes,UA: NEGATIVE
Nitrite, UA: NEGATIVE
Protein,UA: NEGATIVE
RBC, UA: NEGATIVE
Specific Gravity, UA: 1.017 (ref 1.005–1.030)
Urobilinogen, Ur: 0.2 mg/dL (ref 0.2–1.0)
pH, UA: 7 (ref 5.0–7.5)

## 2021-03-09 ENCOUNTER — Ambulatory Visit: Payer: Self-pay | Admitting: Urology

## 2021-06-09 ENCOUNTER — Telehealth: Payer: Managed Care, Other (non HMO) | Admitting: Nurse Practitioner

## 2021-06-09 ENCOUNTER — Telehealth: Payer: Managed Care, Other (non HMO)

## 2021-06-09 DIAGNOSIS — H1033 Unspecified acute conjunctivitis, bilateral: Secondary | ICD-10-CM | POA: Diagnosis not present

## 2021-06-09 MED ORDER — OFLOXACIN 0.3 % OP SOLN: 1.0000 [drp] | Freq: Four times a day (QID) | OPHTHALMIC | 0 refills | Status: DC

## 2021-06-09 NOTE — Patient Instructions (Signed)
Bacterial Conjunctivitis, Adult Bacterial conjunctivitis is an infection of your conjunctiva. This is the clear membrane that covers the white part of your eye and the inner part of your eyelid. This infection can make your eye: Red or pink. Itchy or irritated. This condition spreads easily from person to person (is contagious) and from one eye to the other eye. What are the causes? This condition is caused by germs (bacteria). You may get the infection if you come into close contact with: A person who has the infection. Items that have germs on them (are contaminated), such as face towels, contact lens solution, or eye makeup. What increases the risk? You are more likely to get this condition if: You have contact with people who have the infection. You wear contact lenses. You have a sinus infection. You have had a recent eye injury or surgery. You have a weak body defense system (immune system). You have dry eyes. What are the signs or symptoms?  Thick, yellowish discharge from the eye. Tearing or watery eyes. Itchy eyes. Burning feeling in your eyes. Eye redness. Swollen eyelids. Blurred vision. How is this treated?  Antibiotic eye drops or ointment. Antibiotic medicine taken by mouth. This is used for infections that do not get better with drops or ointment or that last more than 10 days. Cool, wet cloths placed on the eyes. Artificial tears used 2-6 times a day. Follow these instructions at home: Medicines Take or apply your antibiotic medicine as told by your doctor. Do not stop using it even if you start to feel better. Take or apply over-the-counter and prescription medicines only as told by your doctor. Do not touch your eyelid with the eye-drop bottle or the ointment tube. Managing discomfort Wipe any fluid from your eye with a warm, wet washcloth or a cotton ball. Place a clean, cool, wet cloth on your eye. Do this for 10-20 minutes, 3-4 times a day. General  instructions Do not wear contacts until the infection is gone. Wear glasses until your doctor says it is okay to wear contacts again. Do not wear eye makeup until the infection is gone. Throw away old eye makeup. Change or wash your pillowcase every day. Do not share towels or washcloths. Wash your hands often with soap and water for at least 20 seconds and especially before touching your face or eyes. Use paper towels to dry your hands. Do not touch or rub your eyes. Do not drive or use heavy machinery if your vision is blurred. Contact a doctor if: You have a fever. You do not get better after 10 days. Get help right away if: You have a fever and your symptoms get worse all of a sudden. You have very bad pain when you move your eye. Your face: Hurts. Is red. Is swollen. You have sudden loss of vision. Summary Bacterial conjunctivitis is an infection of your conjunctiva. This infection spreads easily from person to person. Wash your hands often with soap and water for at least 20 seconds and especially before touching your face or eyes. Use paper towels to dry your hands. Take or apply your antibiotic medicine as told by your doctor. Contact a doctor if you have a fever or you do not get better after 10 days. This information is not intended to replace advice given to you by your health care provider. Make sure you discuss any questions you have with your health care provider. Document Revised: 10/18/2020 Document Reviewed: 10/18/2020 Elsevier Patient Education    2022 Elsevier Inc.  

## 2021-06-09 NOTE — Progress Notes (Signed)
Virtual Visit Consent   [name], you are scheduled for a virtual visit with [name], FNP, a [facility] provider, today.     Just as with appointments in the office, your consent must be obtained to participate.  Your consent will be active for this visit and any virtual visit you may have with one of our providers in the next 365 days.     If you have a MyChart account, a copy of this consent can be sent to you electronically.  All virtual visits are billed to your insurance company just like a traditional visit in the office.    As this is a virtual visit, video technology does not allow for your provider to perform a traditional examination.  This may limit your provider's ability to fully assess your condition.  If your provider identifies any concerns that need to be evaluated in person or the need to arrange testing (such as labs, EKG, etc.), we will make arrangements to do so.     Although advances in technology are sophisticated, we cannot ensure that it will always work on either your end or our end.  If the connection with a video visit is poor, the visit may have to be switched to a telephone visit.  With either a video or telephone visit, we are not always able to ensure that we have a secure connection.     I need to obtain your verbal consent now.   Are you willing to proceed with your visit today? YES   Avanna Sowder has provided verbal consent on 06/09/2021 for a virtual visit (video or telephone).   [name], FNP   Date: 06/09/2021 9:38 AM   Virtual Visit via Video Note   I, [name], connected with Chelsea Wagner ([id number], 10/18/1975) on 06/09/21 at  9:45 AM EST by a video-enabled telemedicine application and verified that I am speaking with the correct person using two identifiers.  Location: Patient: Virtual Visit Location Patient: Home Provider: Virtual Visit Location Provider: Mobile   I discussed the  limitations of evaluation and management by telemedicine and the availability of in person appointments. The patient expressed understanding and agreed to proceed.    History of Present Illness: Chelsea Wagner is a 45 y.o. who identifies as a female who was assigned female at birth, and is being seen today for red eyes  HPI: Woke up yesterday moring with right eye matting and draining yellow pus. This morning it has spread to her left eye and they were both matted together today. She has slight cingestion but no other symotoms.   Problems:  Patient Active Problem List   Diagnosis Date Noted   Osteoporosis 10/28/2019   PVC's (premature ventricular contractions) 10/28/2019   Sinus bradycardia 10/28/2019   Large breasts 08/25/2019   Acute cystitis without hematuria 04/23/2019   Annual physical exam 05/22/2016   Premature ovarian failure 05/22/2016    Allergies:  Allergies  Allergen Reactions   Elemental Sulfur    Sulfa Antibiotics Hives   Medications:  Current Outpatient Medications:    calcium citrate-vitamin D (CITRACAL+D) 315-200 MG-UNIT tablet, Take 1 tablet by mouth 2 (two) times daily., Disp: , Rfl:    erythromycin ophthalmic ointment, Place 1 application into the left eye 3 (three) times daily. X 4 days to 1 week, Disp: 3.5 g, Rfl: 0   Multiple Vitamins-Minerals (ALIVE ONCE DAILY WOMENS PO), Take by mouth., Disp: , Rfl:    NON FORMULARY, Strontium Citrate Take 1  capsule qd., Disp: , Rfl:   Observations/Objective: Patient is well-developed, well-nourished in no acute distress.  Resting comfortably at home.  Head is normocephalic, atraumatic.  No labored breathing.  Speech is clear and coherent with logical content.  Patient is alert and oriented at baseline.  Bil scleral injection with yellowish dischare in bil inner canthus.  Assessment and Plan:  Tijuanna Jenkins in today with chief complaint of No chief complaint on file.   1. Acute bacterial conjunctivitis of both  eyes Good handwashing Avoid touching or rubbing eyes Cool compresses Meds ordered this encounter  Medications   ofloxacin (OCUFLOX) 0.3 % ophthalmic solution    Sig: Place 1 drop into both eyes 4 (four) times daily.    Dispense:  5 mL    Refill:  0    Order Specific Question:   Supervising Provider    Answer:   Noemi Chapel [[id number]]      Follow Up Instructions: I discussed the assessment and treatment plan with the patient. The patient was provided an opportunity to ask questions and all were answered. The patient agreed with the plan and demonstrated an understanding of the instructions.  A copy of instructions were sent to the patient via MyChart.  The patient was advised to call back or seek an in-person evaluation if the symptoms worsen or if the condition fails to improve as anticipated.  Time:  I spent 12 minutes with the patient via telehealth technology discussing the above problems/concerns.    Mary-Margaret Hassell Done, FNP

## 2021-06-22 ENCOUNTER — Encounter: Payer: Self-pay | Admitting: Cardiology

## 2021-06-22 ENCOUNTER — Other Ambulatory Visit: Payer: Self-pay

## 2021-06-22 ENCOUNTER — Ambulatory Visit: Payer: Managed Care, Other (non HMO) | Admitting: Cardiology

## 2021-06-22 VITALS — BP 100/60 | HR 66 | Ht 69.0 in | Wt 144.5 lb

## 2021-06-22 DIAGNOSIS — I493 Ventricular premature depolarization: Secondary | ICD-10-CM | POA: Diagnosis not present

## 2021-06-22 NOTE — Progress Notes (Signed)
Cardiology Office Note:    Date:  06/22/2021   ID:  Chelsea Wagner, DOB 13-Jan-1976, MRN [id number]  PCP:  McLean-Scocuzza, Pasty Spillers, MD  Cardiologist:  Debbe Odea, MD  Electrophysiologist:  None   Referring MD: McLean-Scocuzza, French Ana *   Chief Complaint  Patient presents with   12 month follow up     "Doing well." Medications reviewed by the patient verbally.     History of Present Illness:    Chelsea Wagner is a 45 y.o. female with history of PVCs who presents for follow-up.    Seen a year ago for irregular heartbeat.  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  Beta-blocker was tried but patient did not tolerate due to symptoms of fatigue.  Symptoms have overall improved with stress mediating exercises.  She feels well, has no concerns at this time.   Prior notes Echo 01/2020 with normal systolic and diastolic function, EF 60 to 65%.  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   Multiple Vitamins-Minerals (ALIVE ONCE DAILY WOMENS PO) Take by mouth.   NON FORMULARY Strontium Citrate Take 1 capsule qd.     Allergies:   Elemental sulfur and Sulfa antibiotics   Social History   Socioeconomic History   Marital status: Married    Spouse name: Not on file   Number of children: Not on file   Years of education: Not on  file   Highest education level: Not on file  Occupational History   Not on file  Tobacco Use   Smoking status: Never   Smokeless tobacco: Never  Vaping Use   Vaping Use: Never used  Substance and Sexual Activity   Alcohol use: Yes    Alcohol/week: 1.0 standard drink    Types: 1 Standard drinks or equivalent per week   Drug use: No   Sexual activity: Yes    Partners: Male  Other Topics Concern   Not on file  Social History Narrative   ** Merged History Encounter **       1 adopted son 5 soon to be 17 y.o em[place] as of 08/25/20 and thinking about adopting another child as of 08/25/20 has adopted daughter Married  Works as Runner, broadcasting/film/video part time and 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 resumed full time love for biology 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;    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   feels safe in relationship with husband     Social Determinants of Health   Financial Resource Strain: Not on file  Food Insecurity: Not on file  Transportation Needs: Not on file  Physical Activity: Not on file  Stress: Not on file  Social Connections: Not on file  Family History: The patient's family history includes Atrial fibrillation in her mother; Bradycardia in her father; Breast cancer (age of onset: 21) in her paternal aunt; Cancer in her paternal aunt; Dementia in her father and maternal grandmother; Diabetes in her father, maternal grandmother, and mother; Fibromyalgia in her mother; Heart Problems in her maternal grandmother; Heart disease in her mother; Hyperlipidemia in her father; Hypertension in her father, maternal grandfather, maternal grandmother, mother, paternal grandfather, and paternal grandmother; Kidney disease in her father; Mental illness in her father; Other in her maternal uncle and mother; Stroke in her paternal grandmother.  ROS:   Please see the history of present illness.     All other systems reviewed and are negative.  EKGs/Labs/Other Studies Reviewed:    The  following studies were reviewed today:   EKG:  EKG is  ordered today.  The ekg ordered today demonstrates sinus rhythm, normal ECG  Recent Labs: 11/21/2020: ALT 14; BUN 14; Creatinine, Ser 0.86; Hemoglobin 13.3; Platelets 247.0; Potassium 4.1; Sodium 139; TSH 2.08  Recent Lipid Panel    Component Value Date/Time   CHOL 215 (H) 11/21/2020 0927   TRIG 33.0 11/21/2020 0927   HDL 99.90 11/21/2020 0927   CHOLHDL 2 11/21/2020 0927   VLDL 6.6 11/21/2020 0927   LDLCALC 108 (H) 11/21/2020 0927    Physical Exam:    VS:  BP 100/60 (BP Location: Left Arm, Patient Position: Sitting, Cuff Size: Normal)   Pulse 66   Ht 5\' 9"  (1.753 m)   Wt 144 lb 8 oz (65.5 kg)   LMP 11/02/2011   SpO2 99%   BMI 21.34 kg/m     Wt Readings from Last 3 Encounters:  06/22/21 144 lb 8 oz (65.5 kg)  08/25/20 148 lb 3.2 oz (67.2 kg)  05/22/20 152 lb (68.9 kg)     GEN:  Well nourished, well developed in no acute distress HEENT: Normal NECK: No JVD; No carotid bruits CARDIAC: Occasional skipped beats, no murmurs, rubs, gallops RESPIRATORY:  Clear to auscultation without rales, wheezing or rhonchi  ABDOMEN: Soft, non-tender, non-distended MUSCULOSKELETAL:  No edema; No deformity  SKIN: Warm and dry NEUROLOGIC:  Alert and oriented x 3 PSYCHIATRIC:  Normal affect   ASSESSMENT:    1. Frequent PVCs     PLAN:    In order of problems listed above:  Patient with history of irregular heartbeats, frequent PVCs, 16% burden on cardiac monitor 10/2019.  Echo with normal systolic diastolic function EF 65%.  EKG today normal sinus rhythm, no arrhythmias, no PVCs noted.  Symptoms currently resolved.  Patient did not tolerate beta-blocker in the past.  Continue to monitor symptoms of any medicines.  Follow-up as needed  Total encounter time 30 minutes  Greater than 50% was spent in counseling and coordination of care with the patient   This note was generated in part or whole with voice recognition software. Voice  recognition is usually quite accurate but there are transcription errors that can and very often do occur. I apologize for any typographical errors that were not detected and corrected.  Medication Adjustments/Labs and Tests Ordered: Current medicines are reviewed at length with the patient today.  Concerns regarding medicines are outlined above.  Orders Placed This Encounter  Procedures   EKG 12-Lead    No orders of the defined types were placed in this encounter.   Patient Instructions  Medication Instructions:  Your physician recommends that you continue on your current medications as directed. Please refer to the  Current Medication list given to you today.  *If you need a refill on your cardiac medications before your next appointment, please call your pharmacy*   Lab Work: None ordered If you have labs (blood work) drawn today and your tests are completely normal, you will receive your results only by: MyChart Message (if you have MyChart) OR A paper copy in the mail If you have any lab test that is abnormal or we need to change your treatment, we will call you to review the results.   Testing/Procedures: None ordered   Follow-Up: At [facility], you and your health needs are our priority.  As part of our continuing mission to provide you with exceptional heart care, we have created designated Provider Care Teams.  These Care Teams include your primary Cardiologist (physician) and Advanced Practice Providers (APPs -  Physician Assistants and Nurse Practitioners) who all work together to provide you with the care you need, when you need it.  We recommend signing up for the patient portal called "MyChart".  Sign up information is provided on this After Visit Summary.  MyChart is used to connect with patients for Virtual Visits (Telemedicine).  Patients are able to view lab/test results, encounter notes, upcoming appointments, etc.  Non-urgent messages can be sent to your  provider as well.   To learn more about what you can do with MyChart, go to [email or web address].    Your next appointment:   Follow up as needed   The format for your next appointment:   In Person  Provider:   You may see Debbe Odea, MD or one of the following Advanced Practice Providers on your designated Care Team:   Nicolasa Ducking, NP Eula Listen, PA-C Cadence Fransico Michael, [place]    Other Instructions    Signed, Debbe Odea, MD  06/22/2021 3:00 PM    [facility]

## 2021-06-22 NOTE — Patient Instructions (Signed)

## 2021-07-04 ENCOUNTER — Telehealth: Payer: Self-pay | Admitting: Internal Medicine

## 2021-07-04 NOTE — Telephone Encounter (Signed)
Called and scheduled Patient for an appointment 07/12/21 2:30

## 2021-07-04 NOTE — Telephone Encounter (Signed)
Pt is requesting for a referral for orthopedic. Pt stated that she hurt her right leg and ankle. Pt stated that ever since she hurt her leg, her leg never been the same. Pt was wondering if a referral can be sent over to orthopedic or do she have to schedule a appt to be seen by pcp first. Pt requesting callback.

## 2021-07-12 ENCOUNTER — Other Ambulatory Visit: Payer: Self-pay

## 2021-07-12 ENCOUNTER — Ambulatory Visit: Payer: Managed Care, Other (non HMO) | Admitting: Internal Medicine

## 2021-07-12 ENCOUNTER — Encounter: Payer: Self-pay | Admitting: Internal Medicine

## 2021-07-12 VITALS — BP 112/72 | HR 69 | Temp 97.0°F | Ht 69.0 in | Wt 145.2 lb

## 2021-07-12 DIAGNOSIS — M7660 Achilles tendinitis, unspecified leg: Secondary | ICD-10-CM

## 2021-07-12 DIAGNOSIS — S86011A Strain of right Achilles tendon, initial encounter: Secondary | ICD-10-CM | POA: Diagnosis not present

## 2021-07-12 DIAGNOSIS — Z1329 Encounter for screening for other suspected endocrine disorder: Secondary | ICD-10-CM

## 2021-07-12 DIAGNOSIS — Z1231 Encounter for screening mammogram for malignant neoplasm of breast: Secondary | ICD-10-CM

## 2021-07-12 DIAGNOSIS — Z1389 Encounter for screening for other disorder: Secondary | ICD-10-CM

## 2021-07-12 DIAGNOSIS — M8580 Other specified disorders of bone density and structure, unspecified site: Secondary | ICD-10-CM

## 2021-07-12 DIAGNOSIS — E785 Hyperlipidemia, unspecified: Secondary | ICD-10-CM

## 2021-07-12 DIAGNOSIS — Z0000 Encounter for general adult medical examination without abnormal findings: Secondary | ICD-10-CM

## 2021-07-12 DIAGNOSIS — E559 Vitamin D deficiency, unspecified: Secondary | ICD-10-CM

## 2021-07-12 DIAGNOSIS — F439 Reaction to severe stress, unspecified: Secondary | ICD-10-CM

## 2021-07-12 NOTE — Patient Instructions (Addendum)
Call back for flu shot here or pharmacy   Call me back 3 months before 11/2020  Referral [facility] or [facility] in [place] out to your therapist  Stress, Adult Stress is a normal reaction to life events. Stress is what you feel when life demands more than you are used to, or more than you think you can handle. Some stress can be useful, such as studying for a test or meeting a deadline at work. Stress that occurs too often or for too long can cause problems. Long-lasting stress is called chronic stress. Chronic stress can affect your emotional health and interfere with relationships and normal daily activities. Too much stress can weaken your body's defense system (immune system) and increase your risk for physical illness. If you already have a medical problem, stress can make it worse. What are the causes? All sorts of life events can cause stress. An event that causes stress for one person may not be stressful for someone else. Major life events, whether positive or negative, commonly cause stress. Examples include: Losing a job or starting a new job. Losing a loved one. Moving to a new town or home. Getting married or divorced. Having a baby. Getting injured or sick. Less obvious life events can also cause stress, especially if they occur day after day or in combination with each other. Examples include: Working long hours. Driving in traffic. Caring for children. Being in debt. Being in a difficult relationship. What are the signs or symptoms? Stress can cause emotional and physical symptoms and can lead to unhealthy behaviors. These include the following: Emotional symptoms Anxiety. This is feeling worried, afraid, on edge, overwhelmed, or out of control. Anger, including irritation or impatience. Depression. This is feeling sad, down, helpless, or guilty. Trouble focusing, remembering, or making decisions. Physical symptoms Aches and pains. These may affect your head,  neck, back, stomach, or other areas of your body. Tight muscles or a clenched jaw. Low energy. Trouble sleeping. Unhealthy behaviors Eating to feel better (overeating) or skipping meals. Working too much or putting off tasks. Smoking, drinking alcohol, or using drugs to feel better. How is this diagnosed? A stress disorder is diagnosed through an assessment by your health care provider. A stress disorder may be diagnosed based on: Your symptoms and any stressful life events. Your medical history. Tests to rule out other causes of your symptoms. Depending on your condition, your health care provider may refer you to a specialist for further evaluation. How is this treated? Stress management techniques are the recommended treatment for stress. Medicine is not typically recommended for treating stress. Techniques to reduce your reaction to stressful life events include: Identifying stress. Monitor yourself for symptoms of stress and notice what causes stress for you. These skills may help you to avoid or prepare for stressful events. Managing time. Set your priorities, keep a calendar of events, and learn to say no. These actions can help you avoid taking on too much. Techniques for dealing with stress include: Rethinking the problem. Try to think realistically about stressful events rather than ignoring them or overreacting. Try to find the positives in a stressful situation rather than focusing on the negatives. Exercise. Physical exercise can release both physical and emotional tension. The key is to find a form of exercise that you enjoy and do it regularly. Relaxation techniques. These relax the body and mind. Find one or more that you enjoy and use the techniques regularly. Examples include: Meditation, deep  breathing, or progressive relaxation techniques. Yoga or tai chi. Biofeedback, mindfulness techniques, or journaling. Listening to music, being in nature, or taking part in other  hobbies. Practicing a healthy lifestyle. Eat a balanced diet, drink plenty of water, limit or avoid caffeine, and get plenty of sleep. Having a strong support network. Spend time with family, friends, or other people you enjoy being around. Express your feelings and talk things over with someone you trust. Counseling or talk therapy with a mental health provider may help if you are having trouble managing stress by yourself. Follow these instructions at home: Lifestyle  Avoid drugs. Do not use any products that contain nicotine or tobacco. These products include cigarettes, chewing tobacco, and vaping devices, such as e-cigarettes. If you need help quitting, ask your health care provider. If you drink alcohol: Limit how much you have to: 0-1 drink a day for women who are not pregnant. 0-2 drinks a day for men. Know how much alcohol is in a drink. In the [place] one drink equals one 12 oz bottle of beer (355 mL), one 5 oz glass of wine (148 mL), or one 1 oz glass of hard liquor (44 mL). Do not use alcohol or drugs to relax. Eat a balanced diet that includes fresh fruits and vegetables, whole grains, lean meats, fish, eggs, beans, and low-fat dairy. Avoid processed foods and foods high in added fat, sugar, and salt. Exercise at least 30 minutes on 5 or more days each week. Get 7-8 hours of sleep each night. General instructions  Practice stress management techniques as told by your health care provider. Drink enough fluid to keep your urine pale yellow. Take over-the-counter and prescription medicines only as told by your health care provider. Keep all follow-up visits. This is important. Contact a health care provider if: Your symptoms get worse. You have new symptoms. You feel overwhelmed by your problems and can no longer manage them by yourself. Get help right away if: You have thoughts of hurting yourself or others. Get help right awayif you feel like you may hurt yourself or others,  or have thoughts about taking your own life. Go to your nearest emergency room or: Call 911. Call the [facility] at [phone] or 988. This is open 24 hours a day. Text the Crisis Text Line at (269)155-0860. Summary Stress is a normal reaction to life events. It can cause problems if it happens too often or for too long. Practicing stress management techniques is the best way to treat stress. Counseling or talk therapy with a mental health provider may help if you are having trouble managing stress by yourself. This information is not intended to replace advice given to you by your health care provider. Make sure you discuss any questions you have with your health care provider. Document Revised: 02/15/2021 Document Reviewed: 02/15/2021 Elsevier Patient Education  [place].  Achilles Tendon Tear The Achilles tendon is a cord-like band that connects the muscles of your lower leg (calf) to your heel. The Achilles tendon is the most common site of tendon tearing (rupture). What are the causes? This condition may be caused by: Stress from a sudden stretching of the tendon. For example, this may occur when you land from a jump or when your heel drops down into a hole on uneven ground. A hard, direct hit to the tendon. Pushing off your foot forcefully, such as when sprinting, jumping, or changing direction while running. What increases the risk? You are  more likely to develop this condition if you: Are a runner. Play sports that involve sprinting, running, or jumping. Play contact sports. Have a weak Achilles tendon. Tendons can weaken from aging, repeat injuries, and chronic tendinitis. Are female. Are 30-62 years of age. Take a type of antibiotic medicine called quinolones. What are the signs or symptoms? Symptoms of this condition include: Hearing a noise, like a pop or a snap, at the time of injury. Severe, sudden pain in the back of the ankle. Swelling  and bruising. Inability to actively point your toes down. Pain when standing or walking. A feeling of reduced strength when you step on the affected foot. How is this diagnosed? This condition is usually diagnosed based on a physical exam. You may also have imaging tests, such as: Ultrasound. X-rays. MRI. How is this treated? This condition may be treated with: Rest. Ice applied to the area. Pain medicine. Crutches. A cast, splint, or other device to keep the ankle from moving (immobilized). Heel wedges to reduce the stretch on your tendon as it heals. Surgery. This option may depend on your age and your activity level. Follow these instructions at home: Medicines Take over-the-counter and prescription medicines only as told by your health care provider. Ask your health care provider if the medicine prescribed to you: Requires you to avoid driving or using heavy machinery. Can cause constipation. You may need to take these actions to prevent or treat constipation: Drink enough fluid to keep your urine pale yellow. Take over-the-counter or prescription medicines. Eat foods that are high in fiber, such as beans, whole grains, and fresh fruits and vegetables. Limit foods that are high in fat and processed sugars, such as fried or sweet foods. If you have a cast: Do not stick anything inside the cast to scratch your skin. Doing that increases your risk of infection. You may put lotion on dry skin around the edges of the cast. Do not put lotion on the skin underneath the cast. If you have a splint or brace: Wear it as told by your health care provider. Remove it only as told by your health care provider. Loosen it if your toes tingle, become numb, or turn cold and blue. If you have a cast, splint, or brace: Keep it clean and dry. Check the skin around it every day. Tell your health care provider about any concerns. Ask your health care provider when it is safe to drive if you have it  on a leg or foot that you use for driving. Bathing Do not take baths, swim, or use a hot tub until your health care provider approves. Ask your health care provider if you may take showers. You may only be allowed to take sponge baths. If the cast, splint, or brace is not waterproof: Do not let it get wet. Cover it with a watertight covering when you take a bath or shower. Managing pain, stiffness, and swelling  If directed, put ice on the injured area. If you have a removable splint or brace, remove it as told by your health care provider. Put ice in a plastic bag. Place a towel between your skin and the bag or between your cast and the bag. Leave the ice on for 20 minutes, 2-3 times a day. Move your toes often to avoid stiffness and to lessen swelling. Raise (elevate) the injured area above the level of your heart while you are sitting or lying down. Activity Return to your normal activities as told  by your health care provider. Ask your health care provider what activities are safe for you. Do not use the injured leg to support your body weight until your health care provider says that you can. Use crutches as told by your health care provider. Ask your health care provider which exercises are safe for you. General instructions Do not put pressure on any part of a cast or splint until it is fully hardened. This may take several hours. Do not use any products that contain nicotine or tobacco, such as cigarettes, e-cigarettes, and chewing tobacco. These can delay healing. If you need help quitting, ask your health care provider. Use heel wedges if told by your health care provider. Keep all follow-up visits as told by your health care provider. This is important. How is this prevented? Do exercises exactly as told by your therapist or health care provider and adjust them as directed. Warm up and stretch before being active. Cool down and stretch after being active. Rest between periods  of activity. Use equipment that fits you. Contact a health care provider if you have: More pain and swelling. Pain that is not controlled with medicines. New symptoms or symptoms that get worse. Problems moving your toes or foot. Warmth in your foot. A fever. Summary An Achilles tendon tear is an injury that involves a tear in this tendon. This injury typically occurs in runners or athletes who play sports that involve sprinting, running, or jumping. An Achilles tendon tear causes sudden severe pain in your ankle and an inability to point your foot down. Treatment for this injury may include rest, ice, and pain medicines. In some cases, surgery may be needed. This information is not intended to replace advice given to you by your health care provider. Make sure you discuss any questions you have with your health care provider. Document Revised: 08/29/2020 Document Reviewed: 08/29/2020 Elsevier Patient Education  2022 [place].  Achilles Tendinitis Achilles tendinitis is inflammation of the tough, cord-like band that attaches the lower leg muscles to the heel bone (Achilles tendon). This is usually caused by overusing the tendon and the ankle joint. Achilles tendinitis usually gets better over time with treatment and caring for yourself at home. It can take weeks or months to heal completely. What are the causes? This condition may be caused by: A sudden increase in exercise or activity, such as running. Doing the same exercises or activities, such as jumping, over and over. Not warming up calf muscles before exercising. Exercising in shoes that are worn out or not made for exercise. Having arthritis or a bone growth (spur) on the back of the heel bone. This can rub against the tendon and hurt it. Age-related wear and tear. Tendons become less flexible with age and are more likely to be injured. What are the signs or symptoms? Common symptoms of this condition include: Pain in the  Achilles tendon or in the back of the leg, just above the heel. The pain usually gets worse with exercise. Stiffness or soreness in the back of the leg, especially in the morning. Swelling of the skin over the Achilles tendon. Thickening of the tendon. Trouble standing on tiptoe. How is this diagnosed? This condition is diagnosed based on your symptoms and a physical exam. You may have tests, including: X-rays. MRI. How is this treated? The goal of treatment is to relieve symptoms and help your injury heal. Treatment may include: Decreasing or stopping activities that caused the tendinitis. This may mean  switching to low-impact exercises like biking or swimming. Icing the injured area. Doing physical therapy, including strengthening and stretching exercises. Taking NSAIDs, such as ibuprofen, to help relieve pain and swelling. Using supportive shoes, wraps, heel lifts, or a walking boot (air cast). Having surgery. This may be done if your symptoms do not improve after other treatments. Using high-energy shock wave impulses to stimulate the healing process (extracorporeal shock wave therapy). This is rare. Having an injection of medicines that help relieve inflammation (corticosteroids). This is rare. Follow these instructions at home: If you have an air cast: Wear the air cast as told by your health care provider. Remove it only as told by your health care provider. Loosen it if your toes tingle, become numb, or turn cold and blue. Keep it clean. If the air cast is not waterproof: Do not let it get wet. Cover it with a watertight covering when you take a bath or shower. Managing pain, stiffness, and swelling  If directed, put ice on the injured area. To do this: If you have a removable air cast, remove it as told by your health care provider. Put ice in a plastic bag. Place a towel between your skin and the bag. Leave the ice on for 20 minutes, 2-3 times a day. Move your toes often  to reduce stiffness and swelling. Raise (elevate) your foot above the level of your heart while you are sitting or lying down. Activity Gradually return to your normal activities as told by your health care provider. Ask your health care provider what activities are safe for you. Do not do activities that cause pain. Consider doing low-impact exercises, like cycling or swimming. Ask your health care provider when it is safe to drive if you have an air cast on your foot. If physical therapy was prescribed, do exercises as told by your health care provider or physical therapist. General instructions If directed, wrap your foot with an elastic bandage or other wrap. This can help to keep your tendon from moving too much while it heals. Your health care provider will show you how to wrap your foot correctly. Wear supportive shoes or heel lifts only as told by your health care provider. Take over-the-counter and prescription medicines only as told by your health care provider. Keep all follow-up visits as told by your health care provider. This is important. Contact a health care provider if you: Have symptoms that get worse. Have pain that does not get better with medicine. Develop new, unexplained symptoms. Develop warmth and swelling in your foot. Have a fever. Get help right away if you: Have a sudden popping sound or sensation in your Achilles tendon followed by severe pain. Cannot move your toes or foot. Cannot put any weight on your foot. Your foot or toes become numb and look white or blue even after loosening your bandage or air cast. Summary Achilles tendinitis is inflammation of the tough, cord-like band that attaches the lower leg muscles to the heel bone (Achilles tendon). This condition is usually caused by overusing the tendon and the ankle joint. It can also be caused by arthritis or normal aging. The most common symptoms of this condition include pain, swelling, or stiffness in  the Achilles tendon or in the back of the leg. This condition is usually treated by decreasing or stopping activities that caused the tendinitis, icing the injured area, taking NSAIDs, and doing physical therapy. This information is not intended to replace advice given to you by  your health care provider. Make sure you discuss any questions you have with your health care provider. Document Revised: 11/23/2018 Document Reviewed: 11/23/2018 Elsevier Patient Education  [place].  Achilles Tendinitis Rehab Ask your health care provider which exercises are safe for you. Do exercises exactly as told by your health care provider and adjust them as directed. It is normal to feel mild stretching, pulling, tightness, or discomfort as you do these exercises. Stop right away if you feel sudden pain or your pain gets worse. Do not begin these exercises until told by your health care provider. Stretching and range-of-motion exercises These exercises warm up your muscles and joints and improve the movement and flexibility of your ankle. These exercises also help to relieve pain. Standing wall calf stretch with straight knee  Stand with your hands against a wall. Extend your left / right leg behind you, and bend your front knee slightly. Keep both of your heels on the floor. Point the toes of your back foot slightly inward. Keeping your heels on the floor and your back knee straight, shift your weight toward the wall. Do not allow your back to arch. You should feel a gentle stretch in your upper calf. Hold this position for __________ seconds. Repeat __________ times. Complete this exercise __________ times a day. Standing wall calf stretch with bent knee Stand with your hands against a wall. Extend your left / right leg behind you, and bend your front knee slightly. Keep both of your heels on the floor. Point the toes of your back foot slightly inward. Keeping your heels on the floor, bend your back  knee slightly. You should feel a gentle stretch deep in your lower calf near your heel. Hold this position for __________ seconds. Repeat __________ times. Complete this exercise __________ times a day. Strengthening exercises These exercises build strength and control of your ankle. Endurance is the ability to use your muscles for a long time, even after they get tired. Plantar flexion with band In this exercise, you push your toes downward, away from you, with an exercise band providing resistance. Sit on the floor with your left / right leg extended. You may put a pillow under your calf to give your foot more room to move. Loop a rubber exercise band or tube around the ball of your left / right foot. The ball of your foot is on the walking surface, right under your toes. The band or tube should be slightly tense when your foot is relaxed. If the band or tube slips, you can put on your shoe or put a washcloth between the band and your foot to help it stay in place. Slowly point your toes downward, pushing them away from you (plantar flexion). Hold this position for __________ seconds. Slowly release the tension in the band or tube, controlling smoothly until your foot is back to the starting position. Repeat steps 1-5 with your left / right leg. Repeat __________ times. Complete this exercise __________ times a day. Eccentric heel drop In this exercise, you stand and slowly raise your heel and then slowly lower it. This exercise lengthens the calf muscles (eccentric) while the heel bears weight. If this exercise is too easy, try doing it while wearing a backpack with weights in it. Stand on a step with the balls of your feet. The ball of your foot is on the walking surface, right under your toes. Do not put your heels on the step. For balance, rest your hands  on the wall or on a railing. Rise up onto the balls of your feet. Keeping your heels up, shift all of your weight to your left / right  leg and pick up your other leg. Slowly lower your left / right leg so your heel drops below the level of the step. Put down your other foot before returning to the start position. If told by your health care provider, build up to: 3 sets of 15 repetitions while keeping your knees straight. 3 sets of 15 repetitions while keeping your knees slightly bent as far as told by your health care provider. Repeat __________ times. Complete this exercise __________ times a day. Balance exercises These exercises improve or maintain your balance. Balance is important in preventing falls. Single leg stand If this exercise is too easy, you can try it with your eyes closed or while standing on a pillow. Without shoes, stand near a railing or in a door frame. Hold on to the railing or door frame as needed. Stand on your left / right foot. Keep your big toe down on the floor and try to keep your arch lifted. Hold this position for __________ seconds. Repeat __________ times. Complete this exercise __________ times a day. This information is not intended to replace advice given to you by your health care provider. Make sure you discuss any questions you have with your health care provider. Document Revised: 08/29/2020 Document Reviewed: 08/29/2020 Elsevier Patient Education  [place].

## 2021-07-12 NOTE — Progress Notes (Signed)
Chief Complaint  Patient presents with   Referral   Leg Pain   F/u  1. 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 Radene Journey [facility]  Review of Systems  Constitutional:  Negative for weight loss.  HENT:  Negative for hearing loss.   Eyes:  Negative for blurred vision.  Respiratory:  Negative for shortness of breath.   Cardiovascular:  Negative for chest pain.  Gastrointestinal:  Negative for abdominal pain and blood in stool.  Genitourinary:  Negative for dysuria.  Musculoskeletal:  Positive for joint pain. Negative for falls.  Skin:  Negative for rash.  Neurological:  Negative for headaches.  Psychiatric/Behavioral:  Negative for depression.        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    Premature ovarian failure    Premature ovarian failure    Past Surgical History:  Procedure Laterality Date   BREAST EXCISIONAL BIOPSY Left    age 69's   BREAST LUMPECTOMY Left 1999   BREAST LUMPECTOMY     benign tumor    BREAST SURGERY     Family History  Problem Relation Age of Onset   Diabetes Mother    Heart disease Mother    Hypertension Mother    Fibromyalgia Mother    Other Mother        cardiac arrythmia    Atrial fibrillation Mother    Mental illness Father        bipolar    Hypertension Father    Hyperlipidemia Father    Diabetes Father    Kidney disease Father    Bradycardia Father    Dementia Father        lewy body dementia 67 died    Breast cancer Paternal Aunt 21   Cancer Paternal Aunt        breast dx'ed in 39s     Other Maternal Uncle        cardiac arrythmia   Hypertension Maternal Grandmother    Dementia Maternal Grandmother    Diabetes Maternal Grandmother    Heart Problems Maternal Grandmother    Hypertension Maternal Grandfather    Hypertension Paternal Grandmother    Stroke Paternal Grandmother    Hypertension Paternal Grandfather    Social History   Socioeconomic History   Marital status: Married    Spouse name: Not on file   Number of children: Not on file   Years of education: Not on file   Highest education level: Not on file  Occupational History   Not on file  Tobacco Use   Smoking status: Never   Smokeless tobacco: Never  Vaping Use   Vaping Use: Never used  Substance and Sexual Activity   Alcohol use: Yes    Alcohol/week: 1.0 standard drink    Types: 1 Standard drinks or equivalent per week   Drug use: No   Sexual activity: Yes    Partners: Male  Other Topics Concern  Not on file  Social History Narrative   ** Merged History Encounter **       1 adopted son 5 soon to be 13 y.o emory as of 08/25/20 and thinking about adopting another child as of 08/25/20 has adopted daughter Married  Works as Pharmacist, 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   Multiple Vitamins-Minerals (ALIVE ONCE DAILY WOMENS PO) Take by mouth.   NON FORMULARY Strontium Citrate Take 1 capsule qd.   Allergies  Allergen Reactions    Elemental Sulfur    Sulfa Antibiotics Hives   No results found for this or any previous visit (from the past 2160 hour(s)). Objective  Body mass index is 21.44 kg/m. Wt Readings from Last 3 Encounters:  07/12/21 145 lb 3.2 oz (65.9 kg)  06/22/21 144 lb 8 oz (65.5 kg)  08/25/20 148 lb 3.2 oz (67.2 kg)   Temp Readings from Last 3 Encounters:  07/12/21 (!) 97 F (36.1 C) (Temporal)  10/28/19 (!) 96.6 F (35.9 C) (Temporal)  01/06/19 (!) 97.1 F (36.2 C)   BP Readings from Last 3 Encounters:  07/12/21 112/72  06/22/21 100/60  08/25/20 110/70   Pulse Readings from Last 3 Encounters:  07/12/21 69  06/22/21 66  08/25/20 60    Physical Exam Vitals and nursing note reviewed.  Constitutional:      Appearance: Normal appearance. She is well-developed and well-groomed.  HENT:     Head: Normocephalic and atraumatic.  Eyes:     Conjunctiva/sclera: Conjunctivae normal.     Pupils: Pupils are equal, round, and reactive to light.  Cardiovascular:     Rate and Rhythm: Normal rate and regular rhythm.     Heart sounds: Normal heart sounds. No murmur heard. Pulmonary:     Effort: Pulmonary effort is normal.     Breath sounds: Normal breath sounds.  Abdominal:     General: Abdomen is flat. Bowel sounds are normal.     Tenderness: There is no abdominal tenderness.  Musculoskeletal:        General: No tenderness.  Skin:    General: Skin is warm and dry.  Neurological:     General: No focal deficit present.     Mental Status: She is alert and oriented to person, place, and time. Mental status is at baseline.     Cranial Nerves: Cranial nerves 2-12 are intact.     Gait: Gait is intact.  Psychiatric:        Attention and Perception: Attention and perception normal.        Mood and Affect: Mood and affect normal.        Speech: Speech normal.        Behavior: Behavior normal. Behavior is cooperative.        Thought Content: Thought content normal.        Cognition and Memory:  Cognition and  memory normal.        Judgment: Judgment normal.    Assessment  Plan  Achilles tendon pain - Plan: Ambulatory referral to Orthopedic Surgery Dr Radene Journey , MR ANKLE RIGHT WO CONTRAST R/o Rupture of right Achilles tendon, initial encounter - Plan: MR ANKLE RIGHT WO CONTRAST  Osteopenia, unspecified location - Plan: Vitamin D (25 hydroxy) Copy dexa [facility] Dr. Lynnette Caffey  Hyperlipidemia, unspecified hyperlipidemia type - Plan: Lipid panel  Stress 2 kids both adopted but 1 pending adoption which is stressful   HM Sch fasting labs 11/21/21 and CPE after this Flu shot due may get her or at pharmacy  Tdap 11/21/20 rec MMR vx  Hep B immune  Hep C declines check  covid shot 3/3 1 day after 3rd dose tired, fever, chills x 24 hrs, nausea x 2 days For now declines 4th   rec healthy diet and exercise   mammo 09/26/20 normal [place] ordered 2023 by PCP (me)   Colonoscopy will refer to [facility] like ~11/2021 pt will disc at f/u or call back better for her to do it ~02/2022 due to husband work sch   DEXA 07/2018 T score -2.4 need to get report from [facility] rec mvt and vitamin D3 2000 extra rec +1000 in Citracal/1200 calcium  Pap [facility] due 2022 and DEXA cc ob/gyn Dr. Lynnette Caffey need copy of this      Provider: Dr. Olivia Mackie McLean-Scocuzza-Internal Medicine

## 2021-07-19 NOTE — Progress Notes (Signed)
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    Premature ovarian failure    Premature ovarian failure    Past Surgical History:  Procedure Laterality Date   BREAST EXCISIONAL BIOPSY Left    age 45's   BREAST LUMPECTOMY Left 1999   BREAST LUMPECTOMY     benign tumor    BREAST SURGERY     Family History  Problem Relation Age of Onset   Diabetes Mother    Heart disease Mother    Hypertension Mother    Fibromyalgia Mother    Other Mother        cardiac arrythmia    Atrial fibrillation Mother    Mental illness Father        bipolar    Hypertension Father    Hyperlipidemia Father    Diabetes Father    Kidney disease Father    Bradycardia Father    Dementia Father        lewy body dementia 53 died    Breast cancer Paternal Aunt 109   Cancer Paternal Aunt        breast dx'ed in 60s    Other Maternal Uncle        cardiac arrythmia   Hypertension Maternal Grandmother    Dementia Maternal Grandmother    Diabetes Maternal Grandmother    Heart Problems Maternal Grandmother    Hypertension Maternal Grandfather    Hypertension Paternal Grandmother    Stroke Paternal Grandmother    Hypertension Paternal Grandfather    Social History   Socioeconomic History   Marital status: Married    Spouse name: Not on file   Number of children: Not on file   Years of education: Not on file   Highest education level: Not on file  Occupational History   Not on file  Tobacco Use   Smoking status: Never   Smokeless tobacco: Never  Vaping Use   Vaping Use: Never used  Substance and Sexual Activity   Alcohol use: Yes    Alcohol/week: 1.0 standard drink    Types: 1 Standard drinks or equivalent per week   Drug use: No   Sexual  activity: Yes    Partners: Male  Other Topics Concern   Not on file  Social History Narrative   ** Merged History Encounter **       1 adopted son 5 soon to be 78 y.o emory as of 08/25/20 and thinking about adopting another child as of 08/25/20 has adopted daughter Married  Works as Runner, 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   Multiple Vitamins-Minerals (ALIVE ONCE DAILY WOMENS PO) Take by mouth.   NON FORMULARY Strontium Citrate Take 1 capsule qd.   Allergies  Allergen Reactions   Elemental Sulfur    Sulfa Antibiotics Hives   No results found for this or any previous visit (from the past 2160 hour(s)). Objective  Body mass index is 21.44 kg/m. Wt Readings from Last 3 Encounters:  07/12/21 145 lb 3.2 oz (65.9 kg)  06/22/21 144 lb 8 oz (65.5 kg)  08/25/20 148 lb 3.2 oz (67.2 kg)   Temp Readings from Last 3 Encounters:  07/12/21 (!) 97 F (36.1 C) (Temporal)  10/28/19 (!) 96.6 F (35.9 C) (Temporal)  01/06/19 (!) 97.1 F (36.2 C)   BP Readings from Last 3 Encounters:  07/12/21 112/72  06/22/21 100/60  08/25/20 110/70   Pulse Readings from Last 3 Encounters:  07/12/21 69  06/22/21 66  08/25/20 60    Physical Exam Musculoskeletal:     Right ankle:     Right Achilles Tendon: Tenderness present.     Comments: Pain with flexion and on tippy toes     Assessment  Plan  Achilles tendon pain - Plan:  Ambulatory referral to Orthopedic Surgery, MR ANKLE RIGHT WO CONTRAST No prior dx studies  Pain with flexion  Tried rest w/o relief  Disc otc voltaren gel to try   Screening mammogram, encounter for - Plan: MM 3D SCREEN BREAST BILATERAL, CANCELED: MM DIAG BREAST TOMO BILATERAL  Rupture of right Achilles tendon, initial encounter - Plan: MR ANKLE RIGHT WO CONTRAST  Osteopenia, unspecified location - Plan: Vitamin D (25 hydroxy)  Annual physical exam - Plan: Comprehensive metabolic panel, Lipid panel, CBC w/Diff, TSH, Urinalysis, Routine w reflex microscopic, Vitamin D (25 hydroxy)  Hyperlipidemia, unspecified hyperlipidemia type - Plan: Lipid panel  Thyroid disorder screening - Plan: TSH  Screening for blood or protein in urine - Plan: Urinalysis, Routine w reflex microscopic  Vitamin D deficiency - Plan: Vitamin D (25 hydroxy)  Stress  Provider: Dr. French Ana McLean-Scocuzza-Internal Medicine

## 2021-07-23 ENCOUNTER — Telehealth: Payer: Managed Care, Other (non HMO) | Admitting: Physician Assistant

## 2021-07-23 DIAGNOSIS — U071 COVID-19: Secondary | ICD-10-CM | POA: Diagnosis not present

## 2021-07-23 MED ORDER — MOLNUPIRAVIR EUA 200MG CAPSULE: 4.0000 | ORAL_CAPSULE | Freq: Two times a day (BID) | ORAL | 0 refills | Status: AC

## 2021-07-23 NOTE — Patient Instructions (Signed)
Chelsea Wagner, thank you for joining Mar Daring, PA-C for today's virtual visit.  While this provider is not your primary care provider (PCP), if your PCP is located in our provider database this encounter information will be shared with them immediately following your visit.  Consent: (Patient) Chelsea Wagner provided verbal consent for this virtual visit at the beginning of the encounter.  Current Medications:  Current Outpatient Medications:    molnupiravir EUA (LAGEVRIO) 200 mg CAPS capsule, Take 4 capsules (800 mg total) by mouth 2 (two) times daily for 5 days., Disp: 40 capsule, Rfl: 0   calcium citrate-vitamin D (CITRACAL+D) 315-200 MG-UNIT tablet, Take 1 tablet by mouth 2 (two) times daily., Disp: , Rfl:    Multiple Vitamins-Minerals (ALIVE ONCE DAILY WOMENS PO), Take by mouth., Disp: , Rfl:    NON FORMULARY, Strontium Citrate Take 1 capsule qd., Disp: , Rfl:    Medications ordered in this encounter:  Meds ordered this encounter  Medications   molnupiravir EUA (LAGEVRIO) 200 mg CAPS capsule    Sig: Take 4 capsules (800 mg total) by mouth 2 (two) times daily for 5 days.    Dispense:  40 capsule    Refill:  0    Order Specific Question:   Supervising Provider    Answer:   Sabra Heck, [place]     *If you need refills on other medications prior to your next appointment, please contact your pharmacy*  Follow-Up: Call back or seek an in-person evaluation if the symptoms worsen or if the condition fails to improve as anticipated.  Other Instructions Molnupiravir Oral Capsules What is this medication? MOLNUPIRAVIR (mol nue pir a vir) treats COVID-19. It is an antiviral medication. It may decrease the risk of developing severe symptoms of COVID-19. It may also decrease the chance of going to the hospital. This medication is not approved by the FDA. The FDA has authorized emergency use of this medication during the COVID-19 pandemic. This medicine may be used for other  purposes; ask your health care provider or pharmacist if you have questions. COMMON BRAND NAME(S): LAGEVRIO What should I tell my care team before I take this medication? They need to know if you have any of these conditions: Any allergies Any serious illness An unusual or allergic reaction to molnupiravir, other medications, foods, dyes, or preservatives Pregnant or trying to get pregnant (Use alternate protection, I.e. condoms, during any sexual interaction for 5 days of treatment and 4 days following treatment Breast-feeding How should I use this medication? Take this medication by mouth with water. Take it as directed on the prescription label at the same time every day. Do not cut, crush or chew this medication. Swallow the capsules whole. You can take it with or without food. If it upsets your stomach, take it with food. Take all of this medication unless your care team tells you to stop it early. Keep taking it even if you think you are better. Talk to your care team about the use of this medication in children. Special care may be needed. Overdosage: If you think you have taken too much of this medicine contact a poison control center or emergency room at once. NOTE: This medicine is only for you. Do not share this medicine with others. What if I miss a dose? If you miss a dose, take it as soon as you can unless it is more than 10 hours late. If it is more than 10 hours late, skip the missed dose. Take  the next dose at the normal time. Do not take extra or 2 doses at the same time to make up for the missed dose. What may interact with this medication? Interactions have not been studied. This list may not describe all possible interactions. Give your health care provider a list of all the medicines, herbs, non-prescription drugs, or dietary supplements you use. Also tell them if you smoke, drink alcohol, or use illegal drugs. Some items may interact with your medicine. What should I watch  for while using this medication? Your condition will be monitored carefully while you are receiving this medication. Visit your care team for regular checkups. Tell your care team if your symptoms do not start to get better or if they get worse. Do not become pregnant while taking this medication. You may need a pregnancy test before starting this medication. Women must use a reliable form of birth control while taking this medication and for 4 days after stopping the medication. Women should inform their care team if they wish to become pregnant or think they might be pregnant. Men should not father a child while taking this medication and for 3 months after stopping it. There is potential for serious harm to an unborn child. Talk to your care team for more information. Do not breast-feed an infant while taking this medication and for 4 days after stopping the medication. What side effects may I notice from receiving this medication? Side effects that you should report to your care team as soon as possible: Allergic reactions--skin rash, itching, hives, swelling of the face, lips, tongue, or throat Side effects that usually do not require medical attention (report these to your care team if they continue or are bothersome): Diarrhea Dizziness Nausea This list may not describe all possible side effects. Call your doctor for medical advice about side effects. You may report side effects to FDA at 1-800-FDA-1088. Where should I keep my medication? Keep out of the reach of children and pets. Store at room temperature between 20 and 25 degrees C (68 and 77 degrees F). Get rid of any unused medication after the expiration date. To get rid of medications that are no longer needed or have expired: Take the medication to a medication take-back program. Check with your pharmacy or law enforcement to find a location. If you cannot return the medication, check the label or package insert to see if the  medication should be thrown out in the garbage or flushed down the toilet. If you are not sure, ask your care team. If it is safe to put it in the trash, take the medication out of the container. Mix the medication with cat litter, dirt, coffee grounds, or other unwanted substance. Seal the mixture in a bag or container. Put it in the trash. NOTE: This sheet is a summary. It may not cover all possible information. If you have questions about this medicine, talk to your doctor, pharmacist, or health care provider.  2022 Elsevier/Gold Standard (2020-07-17 00:00:00)   COVID-19: Quarantine and Isolation Quarantine If you were exposed Quarantine and stay away from others when you have been in close contact with someone who has COVID-19. Isolate If you are sick or test positive Isolate when you are sick or when you have COVID-19, even if you don't have symptoms. When to stay home Calculating quarantine The date of your exposure is considered day 0. Day 1 is the first full day after your last contact with a person who has had  COVID-19. Stay home and away from other people for at least 5 days. Learn why CDC updated guidance for the general public. IF YOU were exposed to COVID-19 and are NOT  up to dateIF YOU were exposed to COVID-19 and are NOT on COVID-19 vaccinations Quarantine for at least 5 days Stay home Stay home and quarantine for at least 5 full days. Wear a well-fitting mask if you must be around others in your home. Do not travel. Get tested Even if you don't develop symptoms, get tested at least 5 days after you last had close contact with someone with COVID-19. After quarantine Watch for symptoms Watch for symptoms until 10 days after you last had close contact with someone with COVID-19. Avoid travel It is best to avoid travel until a full 10 days after you last had close contact with someone with COVID-19. If you develop symptoms Isolate immediately and get tested. Continue to  stay home until you know the results. Wear a well-fitting mask around others. Take precautions until day 10 Wear a well-fitting mask Wear a well-fitting mask for 10 full days any time you are around others inside your home or in public. Do not go to places where you are unable to wear a well-fitting mask. If you must travel during days 6-10, take precautions. Avoid being around people who are more likely to get very sick from COVID-19. IF YOU were exposed to COVID-19 and are  up to dateIF YOU were exposed to COVID-19 and are on COVID-19 vaccinations No quarantine You do not need to stay home unless you develop symptoms. Get tested Even if you don't develop symptoms, get tested at least 5 days after you last had close contact with someone with COVID-19. Watch for symptoms Watch for symptoms until 10 days after you last had close contact with someone with COVID-19. If you develop symptoms Isolate immediately and get tested. Continue to stay home until you know the results. Wear a well-fitting mask around others. Take precautions until day 10 Wear a well-fitting mask Wear a well-fitting mask for 10 full days any time you are around others inside your home or in public. Do not go to places where you are unable to wear a well-fitting mask. Take precautions if traveling Avoid being around people who are more likely to get very sick from COVID-19. IF YOU were exposed to COVID-19 and had confirmed COVID-19 within the past 90 days (you tested positive using a viral test) No quarantine You do not need to stay home unless you develop symptoms. Watch for symptoms Watch for symptoms until 10 days after you last had close contact with someone with COVID-19. If you develop symptoms Isolate immediately and get tested. Continue to stay home until you know the results. Wear a well-fitting mask around others. Take precautions until day 10 Wear a well-fitting mask Wear a well-fitting mask for 10 full days  any time you are around others inside your home or in public. Do not go to places where you are unable to wear a well-fitting mask. Take precautions if traveling Avoid being around people who are more likely to get very sick from COVID-19. Calculating isolation Day 0 is your first day of symptoms or a positive viral test. Day 1 is the first full day after your symptoms developed or your test specimen was collected. If you have COVID-19 or have symptoms, isolate for at least 5 days. IF YOU tested positive for COVID-19 or have symptoms, regardless of vaccination status  Stay home for at least 5 days Stay home for 5 days and isolate from others in your home. Wear a well-fitting mask if you must be around others in your home. Do not travel. Ending isolation if you had symptoms End isolation after 5 full days if you are fever-free for 24 hours (without the use of fever-reducing medication) and your symptoms are improving. Ending isolation if you did NOT have symptoms End isolation after at least 5 full days after your positive test. If you got very sick from COVID-19 or have a weakened immune system You should isolate for at least 10 days. Consult your doctor before ending isolation. Take precautions until day 10 Wear a well-fitting mask Wear a well-fitting mask for 10 full days any time you are around others inside your home or in public. Do not go to places where you are unable to wear a well-fitting mask. Do not travel Do not travel until a full 10 days after your symptoms started or the date your positive test was taken if you had no symptoms. Avoid being around people who are more likely to get very sick from COVID-19. Definitions Exposure Contact with someone infected with SARS-CoV-2, the virus that causes COVID-19, in a way that increases the likelihood of getting infected with the virus. Close contact A close contact is someone who was less than 6 feet away from an infected person  (laboratory-confirmed or a clinical diagnosis) for a cumulative total of 15 minutes or more over a 24-hour period. For example, three individual 5-minute exposures for a total of 15 minutes. People who are exposed to someone with COVID-19 after they completed at least 5 days of isolation are not considered close contacts. Julio Sicks is a strategy used to prevent transmission of COVID-19 by keeping people who have been in close contact with someone with COVID-19 apart from others. Who does not need to quarantine? If you had close contact with someone with COVID-19 and you are in one of the following groups, you do not need to quarantine. You are up to date with your COVID-19 vaccines. You had confirmed COVID-19 within the last 90 days (meaning you tested positive using a viral test). If you are up to date with COVID-19 vaccines, you should wear a well-fitting mask around others for 10 days from the date of your last close contact with someone with COVID-19 (the date of last close contact is considered day 0). Get tested at least 5 days after you last had close contact with someone with COVID-19. If you test positive or develop COVID-19 symptoms, isolate from other people and follow recommendations in the Isolation section below. If you tested positive for COVID-19 with a viral test within the previous 90 days and subsequently recovered and remain without COVID-19 symptoms, you do not need to quarantine or get tested after close contact. You should wear a well-fitting mask around others for 10 days from the date of your last close contact with someone with COVID-19 (the date of last close contact is considered day 0). If you have COVID-19 symptoms, get tested and isolate from other people and follow recommendations in the Isolation section below. Who should quarantine? If you come into close contact with someone with COVID-19, you should quarantine if you are not up to date on COVID-19 vaccines.  This includes people who are not vaccinated. What to do for quarantine Stay home and away from other people for at least 5 days (day 0 through day 5) after  your last contact with a person who has COVID-19. The date of your exposure is considered day 0. Wear a well-fitting mask when around others at home, if possible. For 10 days after your last close contact with someone with COVID-19, watch for fever (100.80F or greater), cough, shortness of breath, or other COVID-19 symptoms. If you develop symptoms, get tested immediately and isolate until you receive your test results. If you test positive, follow isolation recommendations. If you do not develop symptoms, get tested at least 5 days after you last had close contact with someone with COVID-19. If you test negative, you can leave your home, but continue to wear a well-fitting mask when around others at home and in public until 10 days after your last close contact with someone with COVID-19. If you test positive, you should isolate for at least 5 days from the date of your positive test (if you do not have symptoms). If you do develop COVID-19 symptoms, isolate for at least 5 days from the date your symptoms began (the date the symptoms started is day 0). Follow recommendations in the isolation section below. If you are unable to get a test 5 days after last close contact with someone with COVID-19, you can leave your home after day 5 if you have been without COVID-19 symptoms throughout the 5-day period. Wear a well-fitting mask for 10 days after your date of last close contact when around others at home and in public. Avoid people who are have weakened immune systems or are more likely to get very sick from COVID-19, and nursing homes and other high-risk settings, until after at least 10 days. If possible, stay away from people you live with, especially people who are at higher risk for getting very sick from COVID-19, as well as others outside your  home throughout the full 10 days after your last close contact with someone with COVID-19. If you are unable to quarantine, you should wear a well-fitting mask for 10 days when around others at home and in public. If you are unable to wear a mask when around others, you should continue to quarantine for 10 days. Avoid people who have weakened immune systems or are more likely to get very sick from COVID-19, and nursing homes and other high-risk settings, until after at least 10 days. See additional information about travel. Do not go to places where you are unable to wear a mask, such as restaurants and some gyms, and avoid eating around others at home and at work until after 10 days after your last close contact with someone with COVID-19. After quarantine Watch for symptoms until 10 days after your last close contact with someone with COVID-19. If you have symptoms, isolate immediately and get tested. Quarantine in high-risk congregate settings In certain congregate settings that have high risk of secondary transmission (such as Systems analyst and detention facilities, homeless shelters, or cruise ships), CDC recommends a 10-day quarantine for residents, regardless of vaccination and booster status. During periods of critical staffing shortages, facilities may consider shortening the quarantine period for staff to ensure continuity of operations. Decisions to shorten quarantine in these settings should be made in consultation with state, local, tribal, or territorial health departments and should take into consideration the context and characteristics of the facility. CDC's setting-specific guidance provides additional recommendations for these settings. Isolation Isolation is used to separate people with confirmed or suspected COVID-19 from those without COVID-19. People who are in isolation should stay home until it's safe  for them to be around others. At home, anyone sick or infected should separate  from others, or wear a well-fitting mask when they need to be around others. People in isolation should stay in a specific "sick room" or area and use a separate bathroom if available. Everyone who has presumed or confirmed COVID-19 should stay home and isolate from other people for at least 5 full days (day 0 is the first day of symptoms or the date of the day of the positive viral test for asymptomatic persons). They should wear a mask when around others at home and in public for an additional 5 days. People who are confirmed to have COVID-19 or are showing symptoms of COVID-19 need to isolate regardless of their vaccination status. This includes: People who have a positive viral test for COVID-19, regardless of whether or not they have symptoms. People with symptoms of COVID-19, including people who are awaiting test results or have not been tested. People with symptoms should isolate even if they do not know if they have been in close contact with someone with COVID-19. What to do for isolation Monitor your symptoms. If you have an emergency warning sign (including trouble breathing), seek emergency medical care immediately. Stay in a separate room from other household members, if possible. Use a separate bathroom, if possible. Take steps to improve ventilation at home, if possible. Avoid contact with other members of the household and pets. Don't share personal household items, like cups, towels, and utensils. Wear a well-fitting mask when you need to be around other people. Learn more about what to do if you are sick and how to notify your contacts. Ending isolation for people who had COVID-19 and had symptoms If you had COVID-19 and had symptoms, isolate for at least 5 days. To calculate your 5-day isolation period, day 0 is your first day of symptoms. Day 1 is the first full day after your symptoms developed. You can leave isolation after 5 full days. You can end isolation after 5 full days if  you are fever-free for 24 hours without the use of fever-reducing medication and your other symptoms have improved (Loss of taste and smell may persist for weeks or months after recovery and need not delay the end of isolation). You should continue to wear a well-fitting mask around others at home and in public for 5 additional days (day 6 through day 10) after the end of your 5-day isolation period. If you are unable to wear a mask when around others, you should continue to isolate for a full 10 days. Avoid people who have weakened immune systems or are more likely to get very sick from COVID-19, and nursing homes and other high-risk settings, until after at least 10 days. If you continue to have fever or your other symptoms have not improved after 5 days of isolation, you should wait to end your isolation until you are fever-free for 24 hours without the use of fever-reducing medication and your other symptoms have improved. Continue to wear a well-fitting mask through day 10. Contact your healthcare provider if you have questions. See additional information about travel. Do not go to places where you are unable to wear a mask, such as restaurants and some gyms, and avoid eating around others at home and at work until a full 10 days after your first day of symptoms. If an individual has access to a test and wants to test, the best approach is to use an antigen test1  towards the end of the 5-day isolation period. Collect the test sample only if you are fever-free for 24 hours without the use of fever-reducing medication and your other symptoms have improved (loss of taste and smell may persist for weeks or months after recovery and need not delay the end of isolation). If your test result is positive, you should continue to isolate until day 10. If your test result is negative, you can end isolation, but continue to wear a well-fitting mask around others at home and in public until day 10. Follow additional  recommendations for masking and avoiding travel as described above. 1As noted in the labeling for authorized over-the counter antigen tests: Negative results should be treated as presumptive. Negative results do not rule out SARS-CoV-2 infection and should not be used as the sole basis for treatment or patient management decisions, including infection control decisions. To improve results, antigen tests should be used twice over a three-day period with at least 24 hours and no more than 48 hours between tests. Note that these recommendations on ending isolation do not apply to people who are moderately ill or very sick from COVID-19 or have weakened immune systems. See section below for recommendations for when to end isolation for these groups. Ending isolation for people who tested positive for COVID-19 but had no symptoms If you test positive for COVID-19 and never develop symptoms, isolate for at least 5 days. Day 0 is the day of your positive viral test (based on the date you were tested) and day 1 is the first full day after the specimen was collected for your positive test. You can leave isolation after 5 full days. If you continue to have no symptoms, you can end isolation after at least 5 days. You should continue to wear a well-fitting mask around others at home and in public until day 10 (day 6 through day 10). If you are unable to wear a mask when around others, you should continue to isolate for 10 days. Avoid people who have weakened immune systems or are more likely to get very sick from COVID-19, and nursing homes and other high-risk settings, until after at least 10 days. If you develop symptoms after testing positive, your 5-day isolation period should start over. Day 0 is your first day of symptoms. Follow the recommendations above for ending isolation for people who had COVID-19 and had symptoms. See additional information about travel. Do not go to places where you are unable to wear a  mask, such as restaurants and some gyms, and avoid eating around others at home and at work until 10 days after the day of your positive test. If an individual has access to a test and wants to test, the best approach is to use an antigen test1 towards the end of the 5-day isolation period. If your test result is positive, you should continue to isolate until day 10. If your test result is positive, you can also choose to test daily and if your test result is negative, you can end isolation, but continue to wear a well-fitting mask around others at home and in public until day 10. Follow additional recommendations for masking and avoiding travel as described above. 1As noted in the labeling for authorized over-the counter antigen tests: Negative results should be treated as presumptive. Negative results do not rule out SARS-CoV-2 infection and should not be used as the sole basis for treatment or patient management decisions, including infection control decisions. To improve results,  antigen tests should be used twice over a three-day period with at least 24 hours and no more than 48 hours between tests. Ending isolation for people who were moderately or very sick from COVID-19 or have a weakened immune system People who are moderately ill from COVID-19 (experiencing symptoms that affect the lungs like shortness of breath or difficulty breathing) should isolate for 10 days and follow all other isolation precautions. To calculate your 10-day isolation period, day 0 is your first day of symptoms. Day 1 is the first full day after your symptoms developed. If you are unsure if your symptoms are moderate, talk to a healthcare provider for further guidance. People who are very sick from COVID-19 (this means people who were hospitalized or required intensive care or ventilation support) and people who have weakened immune systems might need to isolate at home longer. They may also require testing with a viral test to  determine when they can be around others. CDC recommends an isolation period of at least 10 and up to 20 days for people who were very sick from COVID-19 and for people with weakened immune systems. Consult with your healthcare provider about when you can resume being around other people. If you are unsure if your symptoms are severe or if you have a weakened immune system, talk to a healthcare provider for further guidance. People who have a weakened immune system should talk to their healthcare provider about the potential for reduced immune responses to COVID-19 vaccines and the need to continue to follow current prevention measures (including wearing a well-fitting mask and avoiding crowds and poorly ventilated indoor spaces) to protect themselves against COVID-19 until advised otherwise by their healthcare provider. Close contacts of immunocompromised people--including household members--should also be encouraged to receive all recommended COVID-19 vaccine doses to help protect these people. Isolation in high-risk congregate settings In certain high-risk congregate settings that have high risk of secondary transmission and where it is not feasible to cohort people (such as Systems analyst and detention facilities, homeless shelters, and cruise ships), CDC recommends a 10-day isolation period for residents. During periods of critical staffing shortages, facilities may consider shortening the isolation period for staff to ensure continuity of operations. Decisions to shorten isolation in these settings should be made in consultation with state, local, tribal, or territorial health departments and should take into consideration the context and characteristics of the facility. CDC's setting-specific guidance provides additional recommendations for these settings. This CDC guidance is meant to supplement--not replace--any federal, state, local, territorial, or tribal health and safety laws, rules, and  regulations. Recommendations for specific settings These recommendations do not apply to healthcare professionals. For guidance specific to these settings, see Healthcare professionals: Interim Guidance for Optician, dispensing with SARS-CoV-2 Infection or Exposure to SARS-CoV-2 Patients, residents, and visitors to healthcare settings: Interim Infection Prevention and Control Recommendations for Healthcare Personnel During the [place] 2019 (COVID-19) Pandemic Additional setting-specific guidance and recommendations are available. These recommendations on quarantine and isolation do apply to [facility] settings. Additional guidance is available here: Overview of COVID-19 Quarantine for K-12 Schools Travelers: Travel information and recommendations Congregate facilities and other settings: [employer] for community, work, and school settings Ongoing COVID-19 exposure FAQs I live with someone with COVID-19, but I cannot be separated from them. How do we manage quarantine in this situation? It is very important for people with COVID-19 to remain apart from other people, if possible, even if they are living together. If separation of the person  with COVID-19 from others that they live with is not possible, the other people that they live with will have ongoing exposure, meaning they will be repeatedly exposed until that person is no longer able to spread the virus to other people. In this situation, there are precautions you can take to limit the spread of COVID-19: The person with COVID-19 and everyone they live with should wear a well-fitting mask inside the home. If possible, one person should care for the person with COVID-19 to limit the number of people who are in close contact with the infected person. Take steps to protect yourself and others to reduce transmission in the home: Quarantine if you are not up to date with your COVID-19 vaccines. Isolate if you are sick or  tested positive for COVID-19, even if you don't have symptoms. Learn more about the public health recommendations for testing, mask use and quarantine of close contacts, like yourself, who have ongoing exposure. These recommendations differ depending on your vaccination status. What should I do if I have ongoing exposure to COVID-19 from someone I live with? Recommendations for this situation depend on your vaccination status: If you are not up to date on COVID-19 vaccines and have ongoing exposure to COVID-19, you should: Begin quarantine immediately and continue to quarantine throughout the isolation period of the person with COVID-19. Continue to quarantine for an additional 5 days starting the day after the end of isolation for the person with COVID-19. Get tested at least 5 days after the end of isolation of the infected person that lives with them. If you test negative, you can leave the home but should continue to wear a well-fitting mask when around others at home and in public until 10 days after the end of isolation for the person with COVID-19. Isolate immediately if you develop symptoms of COVID-19 or test positive. If you are up to date with COVID-19 vaccines and have ongoing exposure to COVID-19, you should: Get tested at least 5 days after your first exposure. A person with COVID-19 is considered infectious starting 2 days before they develop symptoms, or 2 days before the date of their positive test if they do not have symptoms. Get tested again at least 5 days after the end of isolation for the person with COVID-19. Wear a well-fitting mask when you are around the person with COVID-19, and do this throughout their isolation period. Wear a well-fitting mask around others for 10 days after the infected person's isolation period ends. Isolate immediately if you develop symptoms of COVID-19 or test positive. What should I do if multiple people I live with test positive for COVID-19 at  different times? Recommendations for this situation depend on your vaccination status: If you are not up to date with your COVID-19 vaccines, you should: Quarantine throughout the isolation period of any infected person that you live with. Continue to quarantine until 5 days after the end of isolation date for the most recently infected person that lives with you. For example, if the last day of isolation of the person most recently infected with COVID-19 was June 30, the new 5-day quarantine period starts on July 1. Get tested at least 5 days after the end of isolation for the most recently infected person that lives with you. Wear a well-fitting mask when you are around any person with COVID-19 while that person is in isolation. Wear a well-fitting mask when you are around other people until 10 days after your last close contact.  Isolate immediately if you develop symptoms of COVID-19 or test positive. If you are up to date with your COVID-19 vaccines, you should: Get tested at least 5 days after your first exposure. A person with COVID-19 is considered infectious starting 2 days before they developed symptoms, or 2 days before the date of their positive test if they do not have symptoms. Get tested again at least 5 days after the end of isolation for the most recently infected person that lives with you. Wear a well-fitting mask when you are around any person with COVID-19 while that person is in isolation. Wear a well-fitting mask around others for 10 days after the end of isolation for the most recently infected person that lives with you. For example, if the last day of isolation for the person most recently infected with COVID-19 was June 30, the new 10-day period to wear a well-fitting mask indoors in public starts on July 1. Isolate immediately if you develop symptoms of COVID-19 or test positive. I had COVID-19 and completed isolation. Do I have to quarantine or get tested if someone I live  with gets COVID-19 shortly after I completed isolation? No. If you recently completed isolation and someone that lives with you tests positive for the virus that causes COVID-19 shortly after the end of your isolation period, you do not have to quarantine or get tested as long as you do not develop new symptoms. Once all of the people that live together have completed isolation or quarantine, refer to the guidance below for new exposures to COVID-19. If you had COVID-19 in the previous 90 days and then came into close contact with someone with COVID-19, you do not have to quarantine or get tested if you do not have symptoms. But you should: Wear a well-fitting mask indoors in public for 10 days after your last close contact. Monitor for COVID-19 symptoms for 10 days from the date of your last close contact. Isolate immediately and get tested if symptoms develop. If more than 90 days have passed since your recovery from infection, follow CDC's recommendations for close contacts. These recommendations will differ depending on your vaccination status. 10/18/2020 Content source: [facility] for Immunization and Respiratory Diseases ([facility]), Division of Viral Diseases This information is not intended to replace advice given to you by your health care provider. Make sure you discuss any questions you have with your health care provider. Document Revised: 02/21/2021 Document Reviewed: 02/21/2021 Elsevier Patient Education  2022 [employer].    If you have been instructed to have an in-person evaluation today at a local Urgent Care facility, please use the link below. It will take you to a list of all of our available [facility], including address, phone number and hours of operation. Please do not delay care.  [facility]  If you or a family member do not have a primary care provider, use the link below to schedule a visit and establish care. When you choose a [facility]  primary care physician or advanced practice provider, you gain a long-term partner in health. Find a Primary Care Provider  Learn more about 's in-office and virtual care options: [facility]

## 2021-07-23 NOTE — Progress Notes (Signed)
Virtual Visit Consent   Chelsea Wagner, you are scheduled for a virtual visit with a [facility] provider today.     Just as with appointments in the office, your consent must be obtained to participate.  Your consent will be active for this visit and any virtual visit you may have with one of our providers in the next 365 days.     If you have a MyChart account, a copy of this consent can be sent to you electronically.  All virtual visits are billed to your insurance company just like a traditional visit in the office.    As this is a virtual visit, video technology does not allow for your provider to perform a traditional examination.  This may limit your provider's ability to fully assess your condition.  If your provider identifies any concerns that need to be evaluated in person or the need to arrange testing (such as labs, EKG, etc.), we will make arrangements to do so.     Although advances in technology are sophisticated, we cannot ensure that it will always work on either your end or our end.  If the connection with a video visit is poor, the visit may have to be switched to a telephone visit.  With either a video or telephone visit, we are not always able to ensure that we have a secure connection.     I need to obtain your verbal consent now.   Are you willing to proceed with your visit today?    Chelsea Wagner has provided verbal consent on 07/23/2021 for a virtual visit (video or telephone).   Mar Daring, PA-C   Date: 07/23/2021 4:37 PM   Virtual Visit via Video Note   I, Mar Daring, connected with  Chelsea Wagner  ([id number], [birth year]) on 07/23/21 at  4:30 PM EST by a video-enabled telemedicine application and verified that I am speaking with the correct person using two identifiers.  Location: Patient: Virtual Visit Location Patient: Home Provider: Virtual Visit Location Provider: Home Office   I discussed the limitations of evaluation and management  by telemedicine and the availability of in person appointments. The patient expressed understanding and agreed to proceed.    History of Present Illness: Chelsea Wagner is a 46 y.o. who identifies as a female who was assigned female at birth, and is being seen today for Covid 60.  HPI: URI  This is a new problem. Episode onset: Symptoms started yesterday; tested positive for covid 19 today. The problem has been gradually worsening. Maximum temperature: subjective fever. Associated symptoms include congestion, headaches (not pronounced), rhinorrhea and a sore throat. Pertinent negatives include no coughing, diarrhea, ear pain, nausea, plugged ear sensation, sinus pain or vomiting. Associated symptoms comments: Myalgias, chills. Fatigue, dizziness. She has tried sleep (hot tea, elderberry, zinc, vit c, cold ease) for the symptoms. The treatment provided no relief.     Problems:  Patient Active Problem List   Diagnosis Date Noted   Osteoporosis 10/28/2019   PVC's (premature ventricular contractions) 10/28/2019   Sinus bradycardia 10/28/2019   Large breasts 08/25/2019   Acute cystitis without hematuria 04/23/2019   Annual physical exam 05/22/2016   Premature ovarian failure 05/22/2016    Allergies:  Allergies  Allergen Reactions   Elemental Sulfur    Sulfa Antibiotics Hives   Medications:  Current Outpatient Medications:    molnupiravir EUA (LAGEVRIO) 200 mg CAPS capsule, Take 4 capsules (800 mg total) by mouth 2 (two) times daily for 5  days., Disp: 40 capsule, Rfl: 0   calcium citrate-vitamin D (CITRACAL+D) 315-200 MG-UNIT tablet, Take 1 tablet by mouth 2 (two) times daily., Disp: , Rfl:    Multiple Vitamins-Minerals (ALIVE ONCE DAILY WOMENS PO), Take by mouth., Disp: , Rfl:    NON FORMULARY, Strontium Citrate Take 1 capsule qd., Disp: , Rfl:   Observations/Objective: Patient is well-developed, well-nourished in no acute distress.  Resting comfortably at home.  Head is  normocephalic, atraumatic.  No labored breathing.  Speech is clear and coherent with logical content.  Patient is alert and oriented at baseline.    Assessment and Plan: 1. COVID-19 - MyChart COVID-19 home monitoring program; Future - molnupiravir EUA (LAGEVRIO) 200 mg CAPS capsule; Take 4 capsules (800 mg total) by mouth 2 (two) times daily for 5 days.  Dispense: 40 capsule; Refill: 0  - Continue OTC symptomatic management of choice - Will send OTC vitamins and supplement information through AVS - Molnupiravir prescribed - Patient enrolled in MyChart symptom monitoring - Push fluids - Rest as needed - Discussed return precautions and when to seek in-person evaluation, sent via AVS as well  Follow Up Instructions: I discussed the assessment and treatment plan with the patient. The patient was provided an opportunity to ask questions and all were answered. The patient agreed with the plan and demonstrated an understanding of the instructions.  A copy of instructions were sent to the patient via MyChart unless otherwise noted below.    The patient was advised to call back or seek an in-person evaluation if the symptoms worsen or if the condition fails to improve as anticipated.  Time:  I spent 13 minutes with the patient via telehealth technology discussing the above problems/concerns.    Mar Daring, PA-C

## 2021-07-24 ENCOUNTER — Encounter: Payer: Self-pay | Admitting: Internal Medicine

## 2021-07-25 ENCOUNTER — Telehealth: Payer: Self-pay | Admitting: Internal Medicine

## 2021-07-25 NOTE — Telephone Encounter (Signed)
Patient seen via video visit 07/23/2021.

## 2021-07-27 ENCOUNTER — Telehealth: Payer: Self-pay | Admitting: Internal Medicine

## 2021-07-27 NOTE — Telephone Encounter (Signed)
-----   Message from Bevelyn Buckles, MD sent at 07/26/2021  4:21 PM EST ----- Regarding: RE: MRI Does pt want to order Xray right ankle?  If so schedule here our office and I will order  She can f/u [facility] for ankle  ----- Message ----- From: Karlyne Greenspan Sent: 07/26/2021   4:15 PM EST To: Pasty Spillers McLean-Scocuzza, MD, # Subject: MRI                                            Good afternoon!  The MRI denied. They will not except the xray that was sent in, they want a current xray so the pt will need to have one done and then we can send in the result and image for another reconsideration. Please advise and Thank you!

## 2021-07-30 NOTE — Telephone Encounter (Signed)
Patient states she saw the specialist Dr French Ana McLean-Scocuzza referred her to. They did an x-ray and referred her to physical therapy. If the physical therapy does not help the specialist will proceed with the MRI.

## 2021-08-01 NOTE — Telephone Encounter (Signed)
Thank you I will close the MRI referral.

## 2021-08-29 ENCOUNTER — Encounter: Payer: Managed Care, Other (non HMO) | Admitting: Internal Medicine

## 2021-09-27 ENCOUNTER — Other Ambulatory Visit: Payer: Self-pay

## 2021-09-27 ENCOUNTER — Ambulatory Visit
Admission: RE | Admit: 2021-09-27 | Discharge: 2021-09-27 | Disposition: A | Discharge status: Home / Self Care | Payer: Managed Care, Other (non HMO) | Source: Ambulatory Visit | Attending: Internal Medicine | Admitting: Internal Medicine

## 2021-09-27 DIAGNOSIS — Z1231 Encounter for screening mammogram for malignant neoplasm of breast: Secondary | ICD-10-CM | POA: Diagnosis not present

## 2021-09-27 LAB — HM MAMMOGRAPHY: HM Mammogram: NORMAL (ref 0–4)

## 2021-11-21 ENCOUNTER — Other Ambulatory Visit (INDEPENDENT_AMBULATORY_CARE_PROVIDER_SITE_OTHER): Payer: Managed Care, Other (non HMO)

## 2021-11-21 DIAGNOSIS — E559 Vitamin D deficiency, unspecified: Secondary | ICD-10-CM | POA: Diagnosis not present

## 2021-11-21 DIAGNOSIS — E785 Hyperlipidemia, unspecified: Secondary | ICD-10-CM

## 2021-11-21 DIAGNOSIS — M8580 Other specified disorders of bone density and structure, unspecified site: Secondary | ICD-10-CM

## 2021-11-21 DIAGNOSIS — Z1329 Encounter for screening for other suspected endocrine disorder: Secondary | ICD-10-CM

## 2021-11-21 DIAGNOSIS — Z1389 Encounter for screening for other disorder: Secondary | ICD-10-CM

## 2021-11-21 DIAGNOSIS — Z0000 Encounter for general adult medical examination without abnormal findings: Secondary | ICD-10-CM

## 2021-11-21 LAB — COMPREHENSIVE METABOLIC PANEL
ALT: 13 U/L (ref 0–35)
AST: 23 U/L (ref 0–37)
Albumin: 4.4 g/dL (ref 3.5–5.2)
Alkaline Phosphatase: 48 U/L (ref 39–117)
BUN: 19 mg/dL (ref 6–23)
CO2: 25 mEq/L (ref 19–32)
Calcium: 9.4 mg/dL (ref 8.4–10.5)
Chloride: 101 mEq/L (ref 96–112)
Creatinine, Ser: 0.88 mg/dL (ref 0.40–1.20)
GFR: 78.23 mL/min (ref >60.00)
Glucose, Bld: 76 mg/dL (ref 70–99)
Potassium: 4 mEq/L (ref 3.5–5.1)
Sodium: 135 mEq/L (ref 135–145)
Total Bilirubin: 0.4 mg/dL (ref 0.2–1.2)
Total Protein: 7.4 g/dL (ref 6.0–8.3)

## 2021-11-21 LAB — LIPID PANEL
Cholesterol: 217 mg/dL — ABNORMAL HIGH (ref 0–200)
HDL: 105 mg/dL (ref >39.00)
LDL Cholesterol: 103 mg/dL — ABNORMAL HIGH (ref 0–99)
NonHDL: 111.87
Total CHOL/HDL Ratio: 2
Triglycerides: 43 mg/dL (ref 0.0–149.0)
VLDL: 8.6 mg/dL (ref 0.0–40.0)

## 2021-11-21 LAB — CBC WITH DIFFERENTIAL/PLATELET
Basophils Absolute: 0.1 10*3/uL (ref 0.0–0.1)
Basophils Relative: 1.3 % (ref 0.0–3.0)
Eosinophils Absolute: 0.1 10*3/uL (ref 0.0–0.7)
Eosinophils Relative: 2.5 % (ref 0.0–5.0)
HCT: 37.6 % (ref 36.0–46.0)
Hemoglobin: 12.9 g/dL (ref 12.0–15.0)
Lymphocytes Relative: 44.2 % (ref 12.0–46.0)
Lymphs Abs: 1.8 10*3/uL (ref 0.7–4.0)
MCHC: 34.2 g/dL (ref 30.0–36.0)
MCV: 96.8 fl (ref 78.0–100.0)
Monocytes Absolute: 0.4 10*3/uL (ref 0.1–1.0)
Monocytes Relative: 9.8 % (ref 3.0–12.0)
Neutro Abs: 1.7 10*3/uL (ref 1.4–7.7)
Neutrophils Relative %: 42.2 % — ABNORMAL LOW (ref 43.0–77.0)
Platelets: 260 10*3/uL (ref 150.0–400.0)
RBC: 3.89 Mil/uL (ref 3.87–5.11)
RDW: 13.5 % (ref 11.5–15.5)
WBC: 4 10*3/uL (ref 4.0–10.5)

## 2021-11-21 LAB — VITAMIN D 25 HYDROXY (VIT D DEFICIENCY, FRACTURES): VITD: 46.13 ng/mL (ref 30.00–100.00)

## 2021-11-21 LAB — TSH: TSH: 1.76 u[IU]/mL (ref 0.35–5.50)

## 2021-11-22 ENCOUNTER — Telehealth: Payer: Self-pay

## 2021-11-22 LAB — URINALYSIS, ROUTINE W REFLEX MICROSCOPIC
Bilirubin Urine: NEGATIVE
Glucose, UA: NEGATIVE
Hgb urine dipstick: NEGATIVE
Ketones, ur: NEGATIVE
Leukocytes,Ua: NEGATIVE
Nitrite: NEGATIVE
Protein, ur: NEGATIVE
Specific Gravity, Urine: 1.006 (ref 1.001–1.035)
pH: 6 (ref 5.0–8.0)

## 2021-11-22 NOTE — Telephone Encounter (Signed)
Lvm for pt to return call in regards to labs. ? ?Per Dr.Tracy: ?Blood cts normal  ?Cholesterol total slightly elevated and LDL bad cholesterol improved  ?-continue healthy diet and exercise  ?Thyroid lab normal  ?Liver kidneys normal  ?Vitamin D normal  ?Urine normal  ?

## 2021-11-28 ENCOUNTER — Encounter: Payer: Self-pay | Admitting: Internal Medicine

## 2021-11-28 ENCOUNTER — Ambulatory Visit (INDEPENDENT_AMBULATORY_CARE_PROVIDER_SITE_OTHER): Payer: Managed Care, Other (non HMO) | Admitting: Internal Medicine

## 2021-11-28 VITALS — BP 102/70 | HR 65 | Temp 98.4°F | Resp 14 | Ht 69.0 in | Wt 147.2 lb

## 2021-11-28 DIAGNOSIS — Z1231 Encounter for screening mammogram for malignant neoplasm of breast: Secondary | ICD-10-CM

## 2021-11-28 DIAGNOSIS — E785 Hyperlipidemia, unspecified: Secondary | ICD-10-CM

## 2021-11-28 DIAGNOSIS — Z1329 Encounter for screening for other suspected endocrine disorder: Secondary | ICD-10-CM

## 2021-11-28 DIAGNOSIS — M810 Age-related osteoporosis without current pathological fracture: Secondary | ICD-10-CM | POA: Diagnosis not present

## 2021-11-28 DIAGNOSIS — Z1389 Encounter for screening for other disorder: Secondary | ICD-10-CM

## 2021-11-28 DIAGNOSIS — Z0000 Encounter for general adult medical examination without abnormal findings: Secondary | ICD-10-CM | POA: Diagnosis not present

## 2021-11-28 NOTE — Progress Notes (Signed)
Chief Complaint  ?Patient presents with  ? Annual Exam  ?  Labs 11/21/21, denies any pain. Had breast exam & vaginal exam around Mar/April of 2023 with [facility] in [place]. Mom recently dx with breast cancer.   ? ?Annual  ?1. Doing well reviewed labs 11/21/21  ? ?Review of Systems  ?Constitutional:  Negative for weight loss.  ?HENT:  Negative for hearing loss.   ?Eyes:  Negative for blurred vision.  ?Respiratory:  Negative for shortness of breath.   ?Cardiovascular:  Negative for chest pain.  ?Gastrointestinal:  Negative for abdominal pain and blood in stool.  ?Genitourinary:  Negative for dysuria.  ?Musculoskeletal:  Negative for falls and joint pain.  ?Skin:  Negative for rash.  ?Neurological:  Negative for headaches.  ?Psychiatric/Behavioral:  Negative for depression.   ?Past Medical History:  ?Diagnosis Date  ? Back pain   ? low back pain   ? COVID-19   ? 07/23/21  ? Fibrocystic breast   ? History of fainting spells of unknown cause   ? Infertility associated with anovulation   ? Osteopenia   ? 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 Premature ovarian failure   ? Premature ovarian failure   ? ?Past Surgical History:  ?Procedure Laterality Date  ? BREAST EXCISIONAL BIOPSY Left   ? age 46's  ? BREAST LUMPECTOMY Left 1999  ? BREAST LUMPECTOMY    ? benign tumor   ? BREAST SURGERY    ? ?Family History  ?Problem Relation Age of Onset  ? Cancer Mother   ?     breast cancer 75 4 mm 11/2021  ? Diabetes Mother   ? Heart disease Mother   ? Hypertension Mother   ? Fibromyalgia Mother   ? Other Mother   ?     cardiac arrythmia   ? Atrial fibrillation Mother   ? Mental illness Father   ?     bipolar   ? Hypertension Father   ? Hyperlipidemia Father   ? Diabetes Father   ? Kidney disease Father   ? Bradycardia Father   ? Dementia Father   ?     lewy body dementia [id number] died   ? Hypertension Maternal Grandmother   ? Dementia Maternal Grandmother   ? Diabetes Maternal Grandmother   ? Heart Problems Maternal Grandmother   ? Hypertension  Maternal Grandfather   ? Hypertension Paternal Grandmother   ? Stroke Paternal Grandmother   ? Hypertension Paternal Grandfather   ? Other Maternal Uncle   ?     cardiac arrythmia  ? Breast cancer Paternal Aunt [id number]  ? Cancer Paternal Aunt   ?     breast dx'ed in 4940s   ? ?Social History  ? ?Socioeconomic History  ? Marital status: Married  ?  Spouse name: Not on file  ? Number of children: Not on file  ? Years of education: Not on file  ? Highest education level: Not on file  ?Occupational History  ? Not on file  ?Tobacco Use  ? Smoking status: Never  ? Smokeless tobacco: Never  ?Vaping Use  ? Vaping Use: Never used  ?Substance and Sexual Activity  ? Alcohol use: Yes  ?  Alcohol/week: 1.0 standard drink  ?  Types: 1 Standard drinks or equivalent per week  ? Drug use: No  ? Sexual activity: Yes  ?  Partners: Male  ?Other Topics Concern  ? Not on file  ?Social History Narrative  ? ** Merged History  Encounter **  ?    ? 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 ?feels safe in relationship with husband  ?  ? ?Social Determinants of Health  ? ?Financial Resource Strain: Not on file  ?Food Insecurity: Not on file  ?Transportation Needs: Not on file  ?Physical Activity: Not on file  ?Stress: Not on file  ?Social Connections: Not on file  ?Intimate Partner Violence: Not on file  ? ?Current Meds  ?Medication Sig  ? calcium citrate-vitamin D (CITRACAL+D) 315-200 MG-UNIT tablet Take 1 tablet by mouth 2 (two) times daily.  ? Multiple Vitamins-Minerals (ALIVE ONCE DAILY WOMENS PO) Take by mouth.  ? NON FORMULARY Strontium Citrate Take 1 capsule qd.  ? ?Allergies  ?Allergen Reactions  ? Elemental Sulfur   ? Sulfa Antibiotics Hives  ? ?Recent Results (from the past  2160 hour(s))  ?Vitamin D (25 hydroxy)     Status: None  ? Collection Time: 11/21/21  8:11 AM  ?Result Value Ref Range  ? VITD 46.13 30.00 - 100.00 ng/mL  ?Urinalysis, Routine w reflex microscopic     Status: None  ? Collection Time: 11/21/21  8:11 AM  ?Result Value Ref Range  ? Color, Urine YELLOW YELLOW  ? APPearance CLEAR CLEAR  ? Specific Gravity, Urine 1.006 1.001 - 1.035  ? pH 6.0 5.0 - 8.0  ? Glucose, UA NEGATIVE NEGATIVE  ? Bilirubin Urine NEGATIVE NEGATIVE  ? Ketones, ur NEGATIVE NEGATIVE  ? Hgb urine dipstick NEGATIVE NEGATIVE  ? Protein, ur NEGATIVE NEGATIVE  ? Nitrite NEGATIVE NEGATIVE  ? Leukocytes,Ua NEGATIVE NEGATIVE  ?TSH     Status: None  ? Collection Time: 11/21/21  8:11 AM  ?Result Value Ref Range  ? TSH 1.76 0.35 - 5.50 uIU/mL  ?CBC w/Diff     Status: Abnormal  ? Collection Time: 11/21/21  8:11 AM  ?Result Value Ref Range  ? WBC 4.0 4.0 - 10.5 K/uL  ? RBC 3.89 3.87 - 5.11 Mil/uL  ? Hemoglobin 12.9 12.0 - 15.0 g/dL  ? HCT 37.6 36.0 - 46.0 %  ? MCV 96.8 78.0 - 100.0 fl  ? MCHC 34.2 30.0 - 36.0 g/dL  ? RDW 13.5 11.5 - 15.5 %  ? Platelets 260.0 150.0 - 400.0 K/uL  ? Neutrophils Relative % 42.2 (L) 43.0 - 77.0 %  ? Lymphocytes Relative 44.2 12.0 - 46.0 %  ? Monocytes Relative 9.8 3.0 - 12.0 %  ? Eosinophils Relative 2.5 0.0 - 5.0 %  ? Basophils Relative 1.3 0.0 - 3.0 %  ? Neutro Abs 1.7 1.4 - 7.7 K/uL  ? Lymphs Abs 1.8 0.7 - 4.0 K/uL  ? Monocytes Absolute 0.4 0.1 - 1.0 K/uL  ? Eosinophils Absolute 0.1 0.0 - 0.7 K/uL  ? Basophils Absolute 0.1 0.0 - 0.1 K/uL  ?Lipid panel     Status: Abnormal  ? Collection Time: 11/21/21  8:11 AM  ?Result Value Ref Range  ? Cholesterol 217 (H) 0 - 200 mg/dL  ?  Comment: ATP III Classification       Desirable:  < 200 mg/dL               Borderline High:  200 -  239 mg/dL          High:  > = 676 mg/dL  ? Triglycerides 43.0 0.0 - 149.0 mg/dL  ?  Comment: Normal:  <150 mg/dLBorderline High:  150 - 199 mg/dL  ? HDL 105.00 >39.00 mg/dL  ? VLDL 8.6 0.0 - 40.0 mg/dL  ? LDL  Cholesterol 103 (H) 0 - 99 mg/dL  ? Total CHOL/HDL Ratio 2   ?  Comment:                Men          Women1/2 Average Risk     3.4          3.3Average Risk          5.0          4.42X Average Risk          9.6          7.13X Average Risk          15.0          11.0                      ? NonHDL 111.87   ?  Comment: NOTE:  Non-HDL goal should be 30 mg/dL higher than patient's LDL goal (i.e. LDL goal of < 70 mg/dL, would have non-HDL goal of < 100 mg/dL)  ?Comprehensive metabolic panel     Status: None  ? Collection Time: 11/21/21  8:11 AM  ?Result Value Ref Range  ? Sodium 135 135 - 145 mEq/L  ? Potassium 4.0 3.5 - 5.1 mEq/L  ? Chloride 101 96 - 112 mEq/L  ? CO2 25 19 - 32 mEq/L  ? Glucose, Bld 76 70 - 99 mg/dL  ? BUN 19 6 - 23 mg/dL  ? Creatinine, Ser 0.88 0.40 - 1.20 mg/dL  ? Total Bilirubin 0.4 0.2 - 1.2 mg/dL  ? Alkaline Phosphatase 48 39 - 117 U/L  ? AST 23 0 - 37 U/L  ? ALT 13 0 - 35 U/L  ? Total Protein 7.4 6.0 - 8.3 g/dL  ? Albumin 4.4 3.5 - 5.2 g/dL  ? GFR 78.23 >60.00 mL/min  ?  Comment: Calculated using the CKD-EPI Creatinine Equation (2021)  ? Calcium 9.4 8.4 - 10.5 mg/dL  ? ?Objective  ?Body mass index is 21.74 kg/m?. ?Wt Readings from Last 3 Encounters:  ?11/28/21 147 lb 3.2 oz (66.8 kg)  ?07/12/21 145 lb 3.2 oz (65.9 kg)  ?06/22/21 144 lb 8 oz (65.5 kg)  ? ?Temp Readings from Last 3 Encounters:  ?11/28/21 98.4 ?F (36.9 ?C) (Oral)  ?07/12/21 (!) 97 ?F (36.1 ?C) (Temporal)  ?10/28/19 (!) 96.6 ?F (35.9 ?C) (Temporal)  ? ?BP Readings from Last 3 Encounters:  ?11/28/21 102/70  ?07/12/21 112/72  ?06/22/21 100/60  ? ?Pulse Readings from Last 3 Encounters:  ?11/28/21 65  ?07/12/21 69  ?06/22/21 66  ? ? ?Physical Exam ?Vitals and nursing note reviewed.  ?Constitutional:   ?   Appearance: Normal appearance. She is well-developed and well-groomed.  ?HENT:  ?   Head: Normocephalic and atraumatic.  ?Eyes:  ?   Conjunctiva/sclera: Conjunctivae normal.  ?   Pupils: Pupils are equal, round, and reactive to light.   ?Cardiovascular:  ?   Rate and Rhythm: Normal rate and regular rhythm.  ?   Heart sounds: Normal heart sounds. No murmur heard. ?Pulmonary:  ?   Effort: Pulmonary effort is normal.  ?  Breath sounds: Normal breat

## 2021-11-28 NOTE — Patient Instructions (Signed)
High Cholesterol ? ?High cholesterol is a condition in which the blood has high levels of a white, waxy substance similar to fat (cholesterol). The liver makes all the cholesterol that the body needs. The human body needs small amounts of cholesterol to help build cells. A person gets extra or excess cholesterol from the food that he or she eats. ?The blood carries cholesterol from the liver to the rest of the body. If you have high cholesterol, deposits (plaques) may build up on the walls of your arteries. Arteries are the blood vessels that carry blood away from your heart. These plaques make the arteries narrow and stiff. ?Cholesterol plaques increase your risk for heart attack and stroke. Work with your health care provider to keep your cholesterol levels in a healthy range. ?What increases the risk? ?The following factors may make you more likely to develop this condition: ?Eating foods that are high in animal fat (saturated fat) or cholesterol. ?Being overweight. ?Not getting enough exercise. ?A family history of high cholesterol (familial hypercholesterolemia). ?Use of tobacco products. ?Having diabetes. ?What are the signs or symptoms? ?In most cases, high cholesterol does not usually cause any symptoms. ?In severe cases, very high cholesterol levels can cause: ?Fatty bumps under the skin (xanthomas). ?A white or gray ring around the black center (pupil) of the eye. ?How is this diagnosed? ?This condition may be diagnosed based on the results of a blood test. ?If you are older than 48 years of age, your health care provider may check your cholesterol levels every 4-6 years. ?You may be checked more often if you have high cholesterol or other risk factors for heart disease. ?The blood test for cholesterol measures: ?"Bad" cholesterol, or LDL cholesterol. This is the main type of cholesterol that causes heart disease. The desired level is less than 100 mg/dL (2.59 mmol/L). ?"Good" cholesterol, or HDL  cholesterol. HDL helps protect against heart disease by cleaning the arteries and carrying the LDL to the liver for processing. The desired level for HDL is 60 mg/dL (1.55 mmol/L) or higher. ?Triglycerides. These are fats that your body can store or burn for energy. The desired level is less than 150 mg/dL (1.69 mmol/L). ?Total cholesterol. This measures the total amount of cholesterol in your blood and includes LDL, HDL, and triglycerides. The desired level is less than 200 mg/dL (5.17 mmol/L). ?How is this treated? ?Treatment for high cholesterol starts with lifestyle changes, such as diet and exercise. ?Diet changes. You may be asked to eat foods that have more fiber and less saturated fats or added sugar. ?Lifestyle changes. These may include regular exercise, maintaining a healthy weight, and quitting use of tobacco products. ?Medicines. These are given when diet and lifestyle changes have not worked. You may be prescribed a statin medicine to help lower your cholesterol levels. ?Follow these instructions at home: ?Eating and drinking ? ?Eat a healthy, balanced diet. This diet includes: ?Daily servings of a variety of fresh, frozen, or canned fruits and vegetables. ?Daily servings of whole grain foods that are rich in fiber. ?Foods that are low in saturated fats and trans fats. These include poultry and fish without skin, lean cuts of meat, and low-fat dairy products. ?A variety of fish, especially oily fish that contain omega-3 fatty acids. Aim to eat fish at least 2 times a week. ?Avoid foods and drinks that have added sugar. ?Use healthy cooking methods, such as roasting, grilling, broiling, baking, poaching, steaming, and stir-frying. Do not fry your food except for  stir-frying. ?If you drink alcohol: ?Limit how much you have to: ?0-1 drink a day for women who are not pregnant. ?0-2 drinks a day for men. ?Know how much alcohol is in a drink. In the [place] one drink equals one 12 oz bottle of beer (355 mL),  one 5 oz glass of wine (148 mL), or one 1? oz glass of hard liquor (44 mL). ?Lifestyle ? ?Get regular exercise. Aim to exercise for a total of 150 minutes a week. Increase your activity level by doing activities such as gardening, walking, and taking the stairs. ?Do not use any products that contain nicotine or tobacco. These products include cigarettes, chewing tobacco, and vaping devices, such as e-cigarettes. If you need help quitting, ask your health care provider. ?General instructions ?Take over-the-counter and prescription medicines only as told by your health care provider. ?Keep all follow-up visits. This is important. ?Where to find more information ?American Heart Association: www.heart.org ?National Heart, Lung, and Blood Institute: PopSteam.is ?Contact a health care provider if: ?You have trouble achieving or maintaining a healthy diet or weight. ?You are starting an exercise program. ?You are unable to stop smoking. ?Get help right away if: ?You have chest pain. ?You have trouble breathing. ?You have discomfort or pain in your jaw, neck, back, shoulder, or arm. ?You have any symptoms of a stroke. "BE FAST" is an easy way to remember the main warning signs of a stroke: ?B - Balance. Signs are dizziness, sudden trouble walking, or loss of balance. ?E - Eyes. Signs are trouble seeing or a sudden change in vision. ?F - Face. Signs are sudden weakness or numbness of the face, or the face or eyelid drooping on one side. ?A - Arms. Signs are weakness or numbness in an arm. This happens suddenly and usually on one side of the body. ?S - Speech. Signs are sudden trouble speaking, slurred speech, or trouble understanding what people say. ?T - Time. Time to call emergency services. Write down what time symptoms started. ?You have other signs of a stroke, such as: ?A sudden, severe headache with no known cause. ?Nausea or vomiting. ?Seizure. ?These symptoms may represent a serious problem that is an  emergency. Do not wait to see if the symptoms will go away. Get medical help right away. Call your local emergency services (911 in the [place] Do not drive yourself to the hospital. ?Summary ?Cholesterol plaques increase your risk for heart attack and stroke. Work with your health care provider to keep your cholesterol levels in a healthy range. ?Eat a healthy, balanced diet, get regular exercise, and maintain a healthy weight. ?Do not use any products that contain nicotine or tobacco. These products include cigarettes, chewing tobacco, and vaping devices, such as e-cigarettes. ?Get help right away if you have any symptoms of a stroke. ?This information is not intended to replace advice given to you by your health care provider. Make sure you discuss any questions you have with your health care provider. ?Document Revised: 09/21/2020 Document Reviewed: 09/11/2020 ?Elsevier Patient Education ? 2023 Elsevier Inc. ? ?Cholesterol Content in Foods ?Cholesterol is a waxy, fat-like substance that helps to carry fat in the blood. The body needs cholesterol in small amounts, but too much cholesterol can cause damage to the arteries and heart. ?What foods have cholesterol? ? ?Cholesterol is found in animal-based foods, such as meat, seafood, and dairy. Generally, low-fat dairy and lean meats have less cholesterol than full-fat dairy and fatty meats. The milligrams of cholesterol per  serving (mg per serving) of common cholesterol-containing foods are listed below. ?Meats and other proteins ?Egg -- one large whole egg has 186 mg. ?Veal shank -- 4 oz (113 g) has 141 mg. ?Lean ground [place] (93% lean) -- 4 oz (113 g) has 118 mg. ?Fat-trimmed lamb loin -- 4 oz (113 g) has 106 mg. ?Lean ground beef (90% lean) -- 4 oz (113 g) has 100 mg. ?Lobster -- 3.5 oz (99 g) has 90 mg. ?Pork loin chops -- 4 oz (113 g) has 86 mg. ?Canned salmon -- 3.5 oz (99 g) has 83 mg. ?Fat-trimmed beef top loin -- 4 oz (113 g) has 78 mg. ?Frankfurter -- 1  frank (3.5 oz or 99 g) has 77 mg. ?Crab -- 3.5 oz (99 g) has 71 mg. ?Roasted chicken without skin, white meat -- 4 oz (113 g) has 66 mg. ?Light bologna -- 2 oz (57 g) has 45 mg. ?Deli-cut [place] -- 2 oz (57 g) has 31 mg.

## 2021-12-04 ENCOUNTER — Encounter: Payer: Self-pay | Admitting: Internal Medicine

## 2021-12-31 ENCOUNTER — Telehealth: Payer: Self-pay | Admitting: Internal Medicine

## 2021-12-31 NOTE — Telephone Encounter (Signed)
Pt came into office to drop off medical evaluation for adoption forms to be filled out by Dr. Tracy... Forms will be upfront in color folder.... 

## 2022-01-01 NOTE — Telephone Encounter (Signed)
Form ready scan and call pt please

## 2022-01-14 LAB — HM COLONOSCOPY

## 2022-04-12 ENCOUNTER — Ambulatory Visit
Admission: EM | Admit: 2022-04-12 | Discharge: 2022-04-12 | Disposition: A | Discharge status: Home / Self Care | Payer: Managed Care, Other (non HMO) | Attending: Urgent Care | Admitting: Urgent Care

## 2022-04-12 DIAGNOSIS — R070 Pain in throat: Secondary | ICD-10-CM | POA: Insufficient documentation

## 2022-04-12 DIAGNOSIS — Z20822 Contact with and (suspected) exposure to covid-19: Secondary | ICD-10-CM | POA: Diagnosis not present

## 2022-04-12 DIAGNOSIS — H9201 Otalgia, right ear: Secondary | ICD-10-CM | POA: Insufficient documentation

## 2022-04-12 DIAGNOSIS — J069 Acute upper respiratory infection, unspecified: Secondary | ICD-10-CM | POA: Insufficient documentation

## 2022-04-12 LAB — POCT RAPID STREP A (OFFICE): Rapid Strep A Screen: NEGATIVE

## 2022-04-12 MED ORDER — LEVOCETIRIZINE DIHYDROCHLORIDE 5 MG PO TABS
5.0000 mg | ORAL_TABLET | Freq: Every evening | ORAL | 0 refills | Status: DC
Start: 2022-04-12 — End: 2023-05-08

## 2022-04-12 MED ORDER — PSEUDOEPHEDRINE HCL 60 MG PO TABS: 60.0000 mg | ORAL_TABLET | Freq: Three times a day (TID) | ORAL | 0 refills | Status: DC | PRN

## 2022-04-12 MED ORDER — IBUPROFEN 600 MG PO TABS: 600.0000 mg | ORAL_TABLET | Freq: Four times a day (QID) | ORAL | 0 refills | Status: DC | PRN

## 2022-04-12 NOTE — Discharge Instructions (Signed)
We will notify you of your test results as they arrive and may take between about 24 hours (for COVID) and 3 days (for strep culture).  I encourage you to sign up for MyChart if you have not already done so as this can be the easiest way for [place] to communicate results to you online or through a phone app.  Generally, we only contact you if it is a positive test result.  In the meantime, if you develop worsening symptoms including fever, chest pain, shortness of breath despite our current treatment plan then please report to the emergency room as this may be a sign of worsening status from possible viral infection.  Otherwise, we will manage this as a viral upper respiratory. For sore throat or cough try using a honey-based tea. Use 3 teaspoons of honey with juice squeezed from half lemon. Place shaved pieces of ginger into 1/2-1 cup of water and warm over stove top. Then mix the ingredients and repeat every 4 hours as needed. Please take Tylenol 500mg -650mg  every 6 hours for aches and pains, fevers.  He can take it together with ibuprofen.  Hydrate very well with at least 2 liters of water. Eat light meals such as soups to replenish electrolytes and soft fruits, veggies. Start an antihistamine like Xyzal for postnasal drainage, sinus congestion.  You can take this together with pseudoephedrine (Sudafed) at a dose of 60 mg 2-3 times a day as needed for the same kind of congestion and can also help with ear fullness and fluid.  Lastly, you can also use zinc lozenges that are available over-the-counter.

## 2022-04-12 NOTE — ED Triage Notes (Signed)
Patient to Urgent Care with complaints of sore throat and right sided ear pain x2 days. Reports pain when swallowing. Denies any known fevers.

## 2022-04-12 NOTE — ED Provider Notes (Signed)
[facility]  Note:  This document was prepared using Conservation officer, historic buildings and may include unintentional dictation errors.  MRN: [id number] DOB: 16-Mar-1976  Subjective:   Chelsea Wagner is a 46 y.o. female presenting for 2-day history of acute onset throat pain, painful swallowing, right ear pain, subjective fever.  No cough, chest pain, shortness of breath, runny or stuffy nose.  Patient is not a smoker.  No current facility-administered medications for this encounter.  Current Outpatient Medications:    calcium citrate-vitamin D (CITRACAL+D) 315-200 MG-UNIT tablet, Take 1 tablet by mouth 2 (two) times daily., Disp: , Rfl:    Multiple Vitamins-Minerals (ALIVE ONCE DAILY WOMENS PO), Take by mouth., Disp: , Rfl:    NON FORMULARY, Strontium Citrate Take 1 capsule qd., Disp: , Rfl:    Allergies  Allergen Reactions   Elemental Sulfur    Sulfa Antibiotics Hives    Past Medical History:  Diagnosis Date   Back pain    low back pain    COVID-19    07/23/21   Fibrocystic breast    History of fainting spells of unknown cause    Infertility associated with anovulation    Osteopenia    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    Premature ovarian failure    Premature ovarian failure      Past Surgical History:  Procedure Laterality Date   BREAST EXCISIONAL BIOPSY Left    age 32's   BREAST LUMPECTOMY Left 1999   BREAST LUMPECTOMY     benign tumor    BREAST SURGERY      Family History  Problem Relation Age of Onset   Cancer Mother        breast cancer 75 4 mm 11/2021   Diabetes Mother    Heart disease Mother    Hypertension Mother    Fibromyalgia Mother    Other Mother        cardiac arrythmia    Atrial fibrillation Mother    Mental illness Father        bipolar    Hypertension Father    Hyperlipidemia Father    Diabetes Father    Kidney disease Father    Bradycardia Father    Dementia Father        lewy body dementia 47 died    Hypertension Maternal  Grandmother    Dementia Maternal Grandmother    Diabetes Maternal Grandmother    Heart Problems Maternal Grandmother    Hypertension Maternal Grandfather    Hypertension Paternal Grandmother    Stroke Paternal Grandmother    Hypertension Paternal Grandfather    Other Maternal Uncle        cardiac arrythmia   Breast cancer Paternal Aunt 46   Cancer Paternal Aunt        breast dx'ed in 42s     Social History   Tobacco Use   Smoking status: Never   Smokeless tobacco: Never  Vaping Use   Vaping Use: Never used  Substance Use Topics   Alcohol use: Yes    Alcohol/week: 1.0 standard drink of alcohol    Types: 1 Standard drinks or equivalent per week   Drug use: No    ROS   Objective:   Vitals: BP 117/83   Pulse 68   Temp 98.3 F (36.8 C)   Resp 17   Ht 5\' 9"  (1.753 m)   Wt 147 lb (66.7 kg)   LMP 11/02/2011   SpO2 98%   BMI 21.71 kg/m  Physical Exam Constitutional:      General: She is not in acute distress.    Appearance: Normal appearance. She is well-developed and normal weight. She is not ill-appearing, toxic-appearing or diaphoretic.  HENT:     Head: Normocephalic and atraumatic.     Right Ear: Tympanic membrane, ear canal and external ear normal. No drainage or tenderness. No middle ear effusion. There is no impacted cerumen. Tympanic membrane is not erythematous.     Left Ear: Tympanic membrane, ear canal and external ear normal. No drainage or tenderness.  No middle ear effusion. There is no impacted cerumen. Tympanic membrane is not erythematous.     Nose: Nose normal. No congestion or rhinorrhea.     Mouth/Throat:     Mouth: Mucous membranes are moist. No oral lesions.     Pharynx: No pharyngeal swelling, oropharyngeal exudate, posterior oropharyngeal erythema or uvula swelling.     Tonsils: No tonsillar exudate or tonsillar abscesses.  Eyes:     General: No scleral icterus.       Right eye: No discharge.        Left eye: No discharge.      Extraocular Movements: Extraocular movements intact.     Right eye: Normal extraocular motion.     Left eye: Normal extraocular motion.     Conjunctiva/sclera: Conjunctivae normal.  Cardiovascular:     Rate and Rhythm: Normal rate.  Pulmonary:     Effort: Pulmonary effort is normal.  Musculoskeletal:     Cervical back: Normal range of motion and neck supple.  Lymphadenopathy:     Cervical: No cervical adenopathy.  Skin:    General: Skin is warm and dry.  Neurological:     General: No focal deficit present.     Mental Status: She is alert and oriented to person, place, and time.  Psychiatric:        Mood and Affect: Mood normal.        Behavior: Behavior normal.        Thought Content: Thought content normal.        Judgment: Judgment normal.    Results for orders placed or performed during the hospital encounter of 04/12/22 (from the past 24 hour(s))  POCT rapid strep A     Status: None   Collection Time: 04/12/22  2:56 PM  Result Value Ref Range   Rapid Strep A Screen Negative Negative    Assessment and Plan :   PDMP not reviewed this encounter.  1. Viral upper respiratory infection   2. Throat pain   3. Acute otalgia, right     Will manage for viral illness such as viral URI, viral pharyngitis, viral syndrome, viral rhinitis, COVID-19. Recommended supportive care. Offered scripts for symptomatic relief. Testing is pending. Counseled patient on potential for adverse effects with medications prescribed/recommended today, ER and return-to-clinic precautions discussed, patient verbalized understanding.     Jaynee Eagles, [place] 04/12/22 1502

## 2022-04-13 LAB — SARS CORONAVIRUS 2 (TAT 6-24 HRS): SARS Coronavirus 2: NEGATIVE

## 2022-04-14 LAB — CULTURE, GROUP A STREP (THRC)

## 2022-04-15 ENCOUNTER — Telehealth (HOSPITAL_COMMUNITY): Payer: Self-pay

## 2022-04-15 MED ORDER — AMOXICILLIN 500 MG PO CAPS: 500.0000 mg | ORAL_CAPSULE | Freq: Two times a day (BID) | ORAL | 0 refills | Status: AC

## 2022-07-16 ENCOUNTER — Ambulatory Visit: Payer: Managed Care, Other (non HMO) | Admitting: Family Medicine

## 2022-08-06 ENCOUNTER — Ambulatory Visit: Payer: Managed Care, Other (non HMO) | Admitting: Family Medicine

## 2022-08-28 ENCOUNTER — Other Ambulatory Visit: Payer: Self-pay

## 2022-08-28 ENCOUNTER — Telehealth: Payer: Self-pay | Admitting: Family Medicine

## 2022-08-28 DIAGNOSIS — Z1231 Encounter for screening mammogram for malignant neoplasm of breast: Secondary | ICD-10-CM

## 2022-08-28 NOTE — Telephone Encounter (Signed)
Patient needs a referral to get her yearly mammogram.

## 2022-08-28 NOTE — Telephone Encounter (Signed)
Order placed  Pt advised

## 2022-10-01 ENCOUNTER — Ambulatory Visit
Admission: RE | Admit: 2022-10-01 | Discharge: 2022-10-01 | Disposition: A | Discharge status: Home / Self Care | Payer: Managed Care, Other (non HMO) | Source: Ambulatory Visit | Attending: Family Medicine | Admitting: Family Medicine

## 2022-10-01 DIAGNOSIS — Z1231 Encounter for screening mammogram for malignant neoplasm of breast: Secondary | ICD-10-CM | POA: Insufficient documentation

## 2022-11-22 ENCOUNTER — Other Ambulatory Visit: Payer: Managed Care, Other (non HMO)

## 2022-11-29 ENCOUNTER — Encounter: Payer: Managed Care, Other (non HMO) | Admitting: Internal Medicine

## 2023-05-07 ENCOUNTER — Encounter: Payer: Managed Care, Other (non HMO) | Admitting: Family Medicine

## 2023-05-08 ENCOUNTER — Encounter: Payer: Self-pay | Admitting: Family Medicine

## 2023-05-08 ENCOUNTER — Ambulatory Visit: Payer: Managed Care, Other (non HMO)

## 2023-05-08 ENCOUNTER — Ambulatory Visit: Payer: Managed Care, Other (non HMO) | Admitting: Family Medicine

## 2023-05-08 VITALS — BP 116/66 | HR 57 | Temp 97.9°F | Resp 16 | Ht 69.0 in | Wt 159.1 lb

## 2023-05-08 DIAGNOSIS — R7309 Other abnormal glucose: Secondary | ICD-10-CM

## 2023-05-08 DIAGNOSIS — Z13220 Encounter for screening for lipoid disorders: Secondary | ICD-10-CM

## 2023-05-08 DIAGNOSIS — R053 Chronic cough: Secondary | ICD-10-CM

## 2023-05-08 DIAGNOSIS — R059 Cough, unspecified: Secondary | ICD-10-CM | POA: Diagnosis not present

## 2023-05-08 DIAGNOSIS — E538 Deficiency of other specified B group vitamins: Secondary | ICD-10-CM | POA: Diagnosis not present

## 2023-05-08 DIAGNOSIS — Z1329 Encounter for screening for other suspected endocrine disorder: Secondary | ICD-10-CM

## 2023-05-08 DIAGNOSIS — E559 Vitamin D deficiency, unspecified: Secondary | ICD-10-CM

## 2023-05-08 DIAGNOSIS — E785 Hyperlipidemia, unspecified: Secondary | ICD-10-CM

## 2023-05-08 DIAGNOSIS — Z1159 Encounter for screening for other viral diseases: Secondary | ICD-10-CM | POA: Diagnosis not present

## 2023-05-08 DIAGNOSIS — M810 Age-related osteoporosis without current pathological fracture: Secondary | ICD-10-CM

## 2023-05-08 LAB — VITAMIN B12: Vitamin B-12: 978 pg/mL — ABNORMAL HIGH (ref 211–911)

## 2023-05-08 LAB — COMPREHENSIVE METABOLIC PANEL
ALT: 16 U/L (ref 0–35)
AST: 24 U/L (ref 0–37)
Albumin: 4.4 g/dL (ref 3.5–5.2)
Alkaline Phosphatase: 46 U/L (ref 39–117)
BUN: 12 mg/dL (ref 6–23)
CO2: 27 meq/L (ref 19–32)
Calcium: 10.1 mg/dL (ref 8.4–10.5)
Chloride: 103 meq/L (ref 96–112)
Creatinine, Ser: 0.83 mg/dL (ref 0.40–1.20)
GFR: 83.06 mL/min (ref >60.00)
Glucose, Bld: 82 mg/dL (ref 70–99)
Potassium: 4.1 meq/L (ref 3.5–5.1)
Sodium: 138 meq/L (ref 135–145)
Total Bilirubin: 0.4 mg/dL (ref 0.2–1.2)
Total Protein: 7.9 g/dL (ref 6.0–8.3)

## 2023-05-08 LAB — CBC WITH DIFFERENTIAL/PLATELET
Basophils Absolute: 0 10*3/uL (ref 0.0–0.1)
Basophils Relative: 0.9 % (ref 0.0–3.0)
Eosinophils Absolute: 0.1 10*3/uL (ref 0.0–0.7)
Eosinophils Relative: 2.4 % (ref 0.0–5.0)
HCT: 41.4 % (ref 36.0–46.0)
Hemoglobin: 13.7 g/dL (ref 12.0–15.0)
Lymphocytes Relative: 34.1 % (ref 12.0–46.0)
Lymphs Abs: 1.8 10*3/uL (ref 0.7–4.0)
MCHC: 33.1 g/dL (ref 30.0–36.0)
MCV: 98.4 fL (ref 78.0–100.0)
Monocytes Absolute: 0.6 10*3/uL (ref 0.1–1.0)
Monocytes Relative: 11.2 % (ref 3.0–12.0)
Neutro Abs: 2.7 10*3/uL (ref 1.4–7.7)
Neutrophils Relative %: 51.4 % (ref 43.0–77.0)
Platelets: 322 10*3/uL (ref 150.0–400.0)
RBC: 4.21 Mil/uL (ref 3.87–5.11)
RDW: 13.8 % (ref 11.5–15.5)
WBC: 5.2 10*3/uL (ref 4.0–10.5)

## 2023-05-08 LAB — LIPID PANEL
Cholesterol: 206 mg/dL — ABNORMAL HIGH (ref 0–200)
HDL: 97.2 mg/dL (ref >39.00)
LDL Cholesterol: 99 mg/dL (ref 0–99)
NonHDL: 108.79
Total CHOL/HDL Ratio: 2
Triglycerides: 48 mg/dL (ref 0.0–149.0)
VLDL: 9.6 mg/dL (ref 0.0–40.0)

## 2023-05-08 LAB — POC COVID19 BINAXNOW: SARS Coronavirus 2 Ag: NEGATIVE

## 2023-05-08 LAB — VITAMIN D 25 HYDROXY (VIT D DEFICIENCY, FRACTURES): VITD: 40.66 ng/mL (ref 30.00–100.00)

## 2023-05-08 LAB — TSH: TSH: 1.34 u[IU]/mL (ref 0.35–5.50)

## 2023-05-08 MED ORDER — PREDNISONE 20 MG PO TABS: 20.0000 mg | ORAL_TABLET | Freq: Every day | ORAL | 0 refills | Status: AC

## 2023-05-08 NOTE — Patient Instructions (Signed)
It was a pleasure meeting you today. Thank you for allowing me to take part in your health care.  Our goals for today as we discussed include:  Start Prednisone 20 mg daily for 5 days  These are the goals we discussed:  Goals   None     This is a list of the screening recommended for you and due dates:  Health Maintenance  Topic Date Due   Hepatitis C Screening  Never done   Colon Cancer Screening  Never done   Flu Shot  02/20/2023   COVID-19 Vaccine (4 - 2023-24 season) 03/23/2023   Pap with HPV screening  10/10/2023   DTaP/Tdap/Td vaccine (2 - Td or Tdap) 11/22/2030   HIV Screening  Completed   HPV Vaccine  Aged Out    Follow up as needed  If you have any questions or concerns, please do not hesitate to call the office at [phone].  I look forward to our next visit and until then take care and stay safe.  Regards,   Dana Allan, MD   [facility]

## 2023-05-08 NOTE — Progress Notes (Signed)
SUBJECTIVE:   Chief Complaint  Patient presents with   Establish Care   HPI Patient presents to clinic to transfer care  Discussed the use of AI scribe software for clinical note transcription with the patient, who gave verbal consent to proceed.  History of Present Illness The patient, previously under the care of Dr. French Ana, presents for a transfer of care. The patient's last visit with Dr. French Ana was in the summer of the previous year. The patient reports a persistent cough for the past two weeks, which she describes as being located in the upper chest area. The cough is not associated with a sore throat or significant congestion, but it does cause shortness of breath, particularly during prolonged talking or at night. The patient denies any fever or other systemic symptoms.  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 She is currently taking calcium, vitamin D, and MK7 supplements, and strontium citrate to manage this condition. 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  The patient's mother has a history of asthma, and her three-year-old daughter has pre-asthma and uses a nebulizer with budesonide and albuterol. The patient denies any personal history of asthma or bronchitis.  The patient's last Pap smear or pelvic exam was conducted this year, and she is due for a mammogram in March of the following year. The patient underwent a colonoscopy the previous year and is not due for another for ten years. The patient is unsure about her tetanus vaccination status.  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 She has two adopted children, aged 33 and three. The patient and her spouse moved back to [facility] in 2016 to be closer to family.    PERTINENT PMH / PSH: As above  OBJECTIVE:  BP 116/66   Pulse (!) 57   Temp 97.9  F (36.6 C)   Resp 16   Ht 5\' 9"  (1.753 m)   Wt 159 lb 2 oz (72.2 kg)   LMP 11/02/2011   SpO2 100%   BMI 23.50 kg/m    Physical Exam Vitals reviewed.  Constitutional:      General: She is not in acute distress.    Appearance: She is not ill-appearing.  HENT:     Head: Normocephalic.     Right Ear: Tympanic membrane, ear canal and external ear normal.     Left Ear: Tympanic membrane, ear canal and external ear normal.     Nose: Nose normal.     Mouth/Throat:     Mouth: Mucous membranes are moist.  Eyes:     Extraocular Movements: Extraocular movements intact.     Conjunctiva/sclera: Conjunctivae normal.     Pupils: Pupils are equal, round, and reactive to light.  Neck:     Thyroid: No thyromegaly or thyroid tenderness.     Vascular: No carotid bruit.  Cardiovascular:     Rate and Rhythm: Normal rate and regular rhythm.     Pulses: Normal pulses.     Heart sounds: Normal heart sounds.  Pulmonary:     Effort: Pulmonary effort is normal.     Breath sounds: Normal breath sounds.  Abdominal:     General: Bowel sounds are normal. There is no distension.     Palpations: Abdomen is soft.     Tenderness: There is no abdominal tenderness. There is no right CVA tenderness, left  CVA tenderness, guarding or rebound.  Musculoskeletal:        General: Normal range of motion.     Cervical back: Normal range of motion.     Right lower leg: No edema.     Left lower leg: No edema.  Lymphadenopathy:     Cervical: No cervical adenopathy.  Skin:    Capillary Refill: Capillary refill takes less than 2 seconds.  Neurological:     General: No focal deficit present.     Mental Status: She is alert and oriented to person, place, and time. Mental status is at baseline.     Motor: No weakness.  Psychiatric:        Mood and Affect: Mood normal.        Behavior: Behavior normal.        Thought Content: Thought content normal.        Judgment: Judgment normal.        05/08/2023   10:21  AM 11/28/2021    1:52 PM 08/25/2020    2:42 PM 10/28/2019    3:24 PM 08/25/2019    8:08 AM  Depression screen PHQ 2/9  Decreased Interest 1 0 0 0 0  Down, Depressed, Hopeless 1 1 1  0 0  PHQ - 2 Score 2 1 1  0 0  Altered sleeping 1  0  0  Tired, decreased energy 1  0  0  Change in appetite 0  0  0  Feeling bad or failure about yourself  0  0  0  Trouble concentrating 0  0  0  Moving slowly or fidgety/restless 0  0  0  Suicidal thoughts 0  0  0  PHQ-9 Score 4  1  0  Difficult doing work/chores Not difficult at all  Not difficult at all  Not difficult at all      05/08/2023   10:21 AM 08/25/2020    2:42 PM 10/28/2019    3:24 PM 08/25/2019    8:13 AM  GAD 7 : Generalized Anxiety Score  Nervous, Anxious, on Edge 0 0 1 0  Control/stop worrying 0 0 0 0  Worry too much - different things 0 0 0 0  Trouble relaxing 1 0 0 0  Restless 0 0 0 0  Easily annoyed or irritable 1 0 0 0  Afraid - awful might happen 1 0 0 0  Total GAD 7 Score 3 0 1 0  Anxiety Difficulty Not difficult at all Not difficult at all Not difficult at all Not difficult at all    ASSESSMENT/PLAN:  Persistent cough Assessment & Plan: Cough for two weeks, predominantly located in the upper chest. No associated fever, runny nose, or other cold symptoms. No history of asthma or bronchitis. -Order chest X-ray to rule out pneumonia or other lung pathology. -Test for COVID-19 negative -Start steroid therapy   Orders: -     Comprehensive metabolic panel -     CBC with Differential/Platelet -     DG Chest 2 View; Future -     POC COVID-19 BinaxNow -     predniSONE; Take 1 tablet (20 mg total) by mouth daily with breakfast for 5 days.  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 Currently taking calcium, vitamin D, and MK7 supplements. -Continue current supplement regimen.      General Health Maintenance -Last Pap smear and mammogram were conducted earlier this year. -Last colonoscopy was conducted last year. Requested records. -Tetanus vaccine up to date -Consider Shingles vaccine next year.    PDMP reviewed  Return if symptoms worsen or fail to improve, for PCP.  Dana Allan, MD

## 2023-05-09 LAB — HEPATITIS C ANTIBODY: Hepatitis C Ab: NONREACTIVE

## 2023-05-11 ENCOUNTER — Encounter: Payer: Self-pay | Admitting: Family Medicine

## 2023-05-11 DIAGNOSIS — Z1329 Encounter for screening for other suspected endocrine disorder: Secondary | ICD-10-CM | POA: Insufficient documentation

## 2023-05-11 DIAGNOSIS — R053 Chronic cough: Secondary | ICD-10-CM | POA: Insufficient documentation

## 2023-05-11 DIAGNOSIS — Z13220 Encounter for screening for lipoid disorders: Secondary | ICD-10-CM | POA: Insufficient documentation

## 2023-05-11 DIAGNOSIS — R7309 Other abnormal glucose: Secondary | ICD-10-CM | POA: Insufficient documentation

## 2023-05-11 DIAGNOSIS — Z1159 Encounter for screening for other viral diseases: Secondary | ICD-10-CM | POA: Insufficient documentation

## 2023-05-11 DIAGNOSIS — E559 Vitamin D deficiency, unspecified: Secondary | ICD-10-CM | POA: Insufficient documentation

## 2023-05-11 DIAGNOSIS — E538 Deficiency of other specified B group vitamins: Secondary | ICD-10-CM | POA: Insufficient documentation

## 2023-05-11 NOTE — Assessment & Plan Note (Addendum)
Cough for two weeks, predominantly located in the upper chest. No associated fever, runny nose, or other cold symptoms. No history of asthma or bronchitis. -Order chest X-ray to rule out pneumonia or other lung pathology. -Test for COVID-19 negative -Start steroid therapy

## 2023-05-11 NOTE — Assessment & Plan Note (Signed)
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 Currently taking calcium, vitamin D, and MK7 supplements. -Continue current supplement regimen.

## 2023-05-15 ENCOUNTER — Encounter: Payer: Self-pay | Admitting: Obstetrics & Gynecology

## 2023-09-23 ENCOUNTER — Ambulatory Visit: Payer: Self-pay | Admitting: Family Medicine

## 2023-09-23 ENCOUNTER — Other Ambulatory Visit: Payer: Self-pay | Admitting: Family Medicine

## 2023-09-23 DIAGNOSIS — Z1231 Encounter for screening mammogram for malignant neoplasm of breast: Secondary | ICD-10-CM

## 2023-09-23 NOTE — Telephone Encounter (Signed)
 Third attempt to contact patient and unable to get a hold of. "Call cannot be completed as dialed." Will route to office.

## 2023-09-23 NOTE — Telephone Encounter (Signed)
 The agent tried to transfer call but her computer froze. This RN made first attempt to contact patient but the call states "The call cannot be completed as dialed."   Copied from CRM [id number]. Topic: Clinical - Red Word Triage >> Sep 23, 2023  4:03 PM Orinda Kenner C wrote: Red Word that prompted transfer to Nurse Triage: Patient [phone] states nasal congestion, chest feels like shortness of breath, fatigue, had the flu a week ago and does not feel well. Patient denies fever, pain, nor headaches.

## 2023-09-23 NOTE — Telephone Encounter (Signed)
 This RN made a second attempt to contact patient. Unable to leave a message-"call cannot be completed as dialed." Will place in call backs.

## 2023-09-24 ENCOUNTER — Telehealth: Payer: Self-pay

## 2023-09-24 NOTE — Telephone Encounter (Signed)
 Pt canceled appointment for 3/6 stated she got OTC and is feeling better.

## 2023-09-24 NOTE — Telephone Encounter (Signed)
 Copied from CRM [id number]. Topic: Appointments - Appointment Cancel/Reschedule >> Sep 24, 2023  2:49 PM Melissa C wrote: Patient/patient representative is calling to cancel or reschedule an appointment. Refer to attachments for appointment information. Patient is calling at 2:50pm on 3/5 to make sure that her 3/6 appointment was cancelled through mychart this morning and it was still on the books. I let her know I would get it cancelled for her and send a message to the office to let them know that it was intended to be cancelled earlier this morning. She got OTC meds that seem to be addressing the issue.

## 2023-09-24 NOTE — Telephone Encounter (Signed)
 Pt has an appointment scheduled for 09/25/23

## 2023-09-24 NOTE — Telephone Encounter (Signed)
 Noted.

## 2023-09-24 NOTE — Telephone Encounter (Signed)
 Lvm to see how pt was doing and triage per previous documented notes. Okay to transfer pt to triage if need be.

## 2023-09-25 ENCOUNTER — Ambulatory Visit: Admitting: Family Medicine

## 2023-10-08 ENCOUNTER — Ambulatory Visit
Admission: RE | Admit: 2023-10-08 | Discharge: 2023-10-08 | Disposition: A | Discharge status: Home / Self Care | Source: Ambulatory Visit | Attending: Family Medicine | Admitting: Family Medicine

## 2023-10-08 DIAGNOSIS — Z1231 Encounter for screening mammogram for malignant neoplasm of breast: Secondary | ICD-10-CM | POA: Insufficient documentation
